# Patient Record
Sex: Male | Born: 1945 | Race: White | Hispanic: No | Smoking: Never smoker
Health system: Southern US, Community
[De-identification: ages and names within clinical notes are randomized; demographics above are authoritative.]

## PROBLEM LIST (undated history)

## (undated) DIAGNOSIS — Z8719 Personal history of other diseases of the digestive system: Secondary | ICD-10-CM

## (undated) DIAGNOSIS — Z8489 Family history of other specified conditions: Secondary | ICD-10-CM

## (undated) DIAGNOSIS — J329 Chronic sinusitis, unspecified: Secondary | ICD-10-CM

## (undated) DIAGNOSIS — IMO0002 Reserved for concepts with insufficient information to code with codable children: Secondary | ICD-10-CM

## (undated) DIAGNOSIS — E669 Obesity, unspecified: Secondary | ICD-10-CM

## (undated) DIAGNOSIS — C44311 Basal cell carcinoma of skin of nose: Secondary | ICD-10-CM

## (undated) DIAGNOSIS — M545 Low back pain, unspecified: Secondary | ICD-10-CM

## (undated) DIAGNOSIS — R5383 Other fatigue: Secondary | ICD-10-CM

## (undated) DIAGNOSIS — M25559 Pain in unspecified hip: Secondary | ICD-10-CM

## (undated) DIAGNOSIS — K219 Gastro-esophageal reflux disease without esophagitis: Secondary | ICD-10-CM

## (undated) DIAGNOSIS — N289 Disorder of kidney and ureter, unspecified: Secondary | ICD-10-CM

## (undated) DIAGNOSIS — T8859XA Other complications of anesthesia, initial encounter: Secondary | ICD-10-CM

## (undated) DIAGNOSIS — E785 Hyperlipidemia, unspecified: Secondary | ICD-10-CM

## (undated) DIAGNOSIS — M1A9XX1 Chronic gout, unspecified, with tophus (tophi): Secondary | ICD-10-CM

## (undated) DIAGNOSIS — K449 Diaphragmatic hernia without obstruction or gangrene: Secondary | ICD-10-CM

## (undated) DIAGNOSIS — Z524 Kidney donor: Secondary | ICD-10-CM

## (undated) DIAGNOSIS — Z9989 Dependence on other enabling machines and devices: Secondary | ICD-10-CM

## (undated) DIAGNOSIS — R0789 Other chest pain: Secondary | ICD-10-CM

## (undated) DIAGNOSIS — Q600 Renal agenesis, unilateral: Secondary | ICD-10-CM

## (undated) DIAGNOSIS — Z905 Acquired absence of kidney: Secondary | ICD-10-CM

## (undated) DIAGNOSIS — IMO0001 Reserved for inherently not codable concepts without codable children: Secondary | ICD-10-CM

## (undated) DIAGNOSIS — I25810 Atherosclerosis of coronary artery bypass graft(s) without angina pectoris: Secondary | ICD-10-CM

## (undated) DIAGNOSIS — I2510 Atherosclerotic heart disease of native coronary artery without angina pectoris: Secondary | ICD-10-CM

## (undated) DIAGNOSIS — M25519 Pain in unspecified shoulder: Secondary | ICD-10-CM

## (undated) DIAGNOSIS — G4733 Obstructive sleep apnea (adult) (pediatric): Secondary | ICD-10-CM

## (undated) DIAGNOSIS — I10 Essential (primary) hypertension: Secondary | ICD-10-CM

## (undated) DIAGNOSIS — T4145XA Adverse effect of unspecified anesthetic, initial encounter: Secondary | ICD-10-CM

## (undated) HISTORY — DX: Disorder of kidney and ureter, unspecified: N28.9

## (undated) HISTORY — DX: Diaphragmatic hernia without obstruction or gangrene: K44.9

## (undated) HISTORY — DX: Renal agenesis, unilateral: Q60.0

## (undated) HISTORY — DX: Low back pain, unspecified: M54.50

## (undated) HISTORY — DX: Other fatigue: R53.83

## (undated) HISTORY — DX: Chronic sinusitis, unspecified: J32.9

## (undated) HISTORY — PX: CORONARY ANGIOPLASTY: SHX604

## (undated) HISTORY — DX: Reserved for concepts with insufficient information to code with codable children: IMO0002

## (undated) HISTORY — DX: Reserved for inherently not codable concepts without codable children: IMO0001

## (undated) HISTORY — DX: Acquired absence of kidney: Z90.5

## (undated) HISTORY — DX: Essential (primary) hypertension: I10

## (undated) HISTORY — PX: TONSILLECTOMY AND ADENOIDECTOMY: SUR1326

## (undated) HISTORY — DX: Other chest pain: R07.89

## (undated) HISTORY — DX: Kidney donor: Z52.4

## (undated) HISTORY — DX: Obesity, unspecified: E66.9

## (undated) HISTORY — DX: Low back pain: M54.5

## (undated) HISTORY — DX: Chronic gout, unspecified, with tophus (tophi): M1A.9XX1

## (undated) HISTORY — DX: Pain in unspecified shoulder: M25.519

## (undated) HISTORY — DX: Atherosclerosis of coronary artery bypass graft(s) without angina pectoris: I25.810

## (undated) HISTORY — PX: REPAIR PERONEAL TENDONS ANKLE: SUR1201

## (undated) HISTORY — DX: Hyperlipidemia, unspecified: E78.5

## (undated) HISTORY — DX: Pain in unspecified hip: M25.559

---

## 2000-07-03 ENCOUNTER — Ambulatory Visit (HOSPITAL_BASED_OUTPATIENT_CLINIC_OR_DEPARTMENT_OTHER): Admission: RE | Admit: 2000-07-03 | Discharge: 2000-07-03 | Payer: Self-pay | Admitting: Orthopedic Surgery

## 2001-02-10 ENCOUNTER — Ambulatory Visit (HOSPITAL_BASED_OUTPATIENT_CLINIC_OR_DEPARTMENT_OTHER): Admission: RE | Admit: 2001-02-10 | Discharge: 2001-02-10 | Payer: Self-pay | Admitting: Family Medicine

## 2002-03-10 ENCOUNTER — Ambulatory Visit (HOSPITAL_BASED_OUTPATIENT_CLINIC_OR_DEPARTMENT_OTHER): Admission: RE | Admit: 2002-03-10 | Discharge: 2002-03-10 | Payer: Self-pay | Admitting: Orthopedic Surgery

## 2004-06-15 ENCOUNTER — Ambulatory Visit (HOSPITAL_COMMUNITY): Admission: RE | Admit: 2004-06-15 | Discharge: 2004-06-15 | Payer: Self-pay | Admitting: Gastroenterology

## 2004-07-09 DIAGNOSIS — Z524 Kidney donor: Secondary | ICD-10-CM

## 2004-07-09 HISTORY — PX: KIDNEY DONATION: SHX685

## 2004-07-09 HISTORY — DX: Kidney donor: Z52.4

## 2010-07-09 DIAGNOSIS — C44311 Basal cell carcinoma of skin of nose: Secondary | ICD-10-CM

## 2010-07-09 HISTORY — PX: BASAL CELL CARCINOMA EXCISION: SHX1214

## 2010-07-09 HISTORY — DX: Basal cell carcinoma of skin of nose: C44.311

## 2011-03-26 ENCOUNTER — Ambulatory Visit: Payer: 59 | Attending: Family Medicine | Admitting: Physical Therapy

## 2011-03-26 DIAGNOSIS — R5381 Other malaise: Secondary | ICD-10-CM | POA: Insufficient documentation

## 2011-03-26 DIAGNOSIS — M545 Low back pain, unspecified: Secondary | ICD-10-CM | POA: Insufficient documentation

## 2011-03-26 DIAGNOSIS — IMO0001 Reserved for inherently not codable concepts without codable children: Secondary | ICD-10-CM | POA: Insufficient documentation

## 2011-03-29 ENCOUNTER — Ambulatory Visit: Payer: 59 | Admitting: *Deleted

## 2011-04-03 ENCOUNTER — Ambulatory Visit: Payer: 59 | Admitting: *Deleted

## 2011-04-05 ENCOUNTER — Ambulatory Visit: Payer: 59 | Admitting: *Deleted

## 2014-10-08 HISTORY — PX: CARDIAC CATHETERIZATION: SHX172

## 2014-11-04 ENCOUNTER — Encounter (HOSPITAL_COMMUNITY)
Admission: RE | Disposition: A | Discharge status: Home / Self Care | Payer: Self-pay | Source: Ambulatory Visit | Attending: Cardiology

## 2014-11-04 ENCOUNTER — Ambulatory Visit (HOSPITAL_COMMUNITY)
Admission: RE | Admit: 2014-11-04 | Discharge: 2014-11-04 | Disposition: A | Discharge status: Home / Self Care | Payer: PPO | Source: Ambulatory Visit | Attending: Cardiology | Admitting: Cardiology

## 2014-11-04 ENCOUNTER — Encounter (HOSPITAL_COMMUNITY): Payer: Self-pay | Admitting: Cardiology

## 2014-11-04 DIAGNOSIS — R9439 Abnormal result of other cardiovascular function study: Secondary | ICD-10-CM

## 2014-11-04 DIAGNOSIS — I2510 Atherosclerotic heart disease of native coronary artery without angina pectoris: Secondary | ICD-10-CM | POA: Diagnosis not present

## 2014-11-04 DIAGNOSIS — E669 Obesity, unspecified: Secondary | ICD-10-CM | POA: Diagnosis not present

## 2014-11-04 DIAGNOSIS — M109 Gout, unspecified: Secondary | ICD-10-CM | POA: Diagnosis not present

## 2014-11-04 DIAGNOSIS — Z6829 Body mass index (BMI) 29.0-29.9, adult: Secondary | ICD-10-CM | POA: Insufficient documentation

## 2014-11-04 DIAGNOSIS — E785 Hyperlipidemia, unspecified: Secondary | ICD-10-CM | POA: Insufficient documentation

## 2014-11-04 DIAGNOSIS — R079 Chest pain, unspecified: Secondary | ICD-10-CM | POA: Diagnosis present

## 2014-11-04 DIAGNOSIS — F1099 Alcohol use, unspecified with unspecified alcohol-induced disorder: Secondary | ICD-10-CM | POA: Insufficient documentation

## 2014-11-04 HISTORY — PX: LEFT HEART CATHETERIZATION WITH CORONARY ANGIOGRAM: SHX5451

## 2014-11-04 SURGERY — LEFT HEART CATHETERIZATION WITH CORONARY ANGIOGRAM

## 2014-11-04 MED ORDER — SODIUM CHLORIDE 0.9 % IJ SOLN: 3.0000 mL | INTRAMUSCULAR | Status: DC | PRN

## 2014-11-04 MED ORDER — ASPIRIN 81 MG PO CHEW
81.0000 mg | CHEWABLE_TABLET | ORAL | Status: AC
  Administered 2014-11-04: 81 mg via ORAL

## 2014-11-04 MED ORDER — SODIUM CHLORIDE 0.9 % IV SOLN
Freq: Once | INTRAVENOUS | Status: AC
  Administered 2014-11-04: 1000 mL via INTRAVENOUS

## 2014-11-04 MED ORDER — SODIUM CHLORIDE 0.9 % IJ SOLN: 3.0000 mL | Freq: Two times a day (BID) | INTRAMUSCULAR | Status: DC

## 2014-11-04 MED ORDER — SODIUM CHLORIDE 0.9 % IV SOLN: 1.0000 mL/kg/h | INTRAVENOUS | Status: DC

## 2014-11-04 MED ORDER — LIDOCAINE HCL (PF) 1 % IJ SOLN
INTRAMUSCULAR | Status: AC
  Filled 2014-11-04: qty 30

## 2014-11-04 MED ORDER — SODIUM CHLORIDE 0.9 % IV SOLN: INTRAVENOUS | Status: DC

## 2014-11-04 MED ORDER — NITROGLYCERIN 1 MG/10 ML FOR IR/CATH LAB
INTRA_ARTERIAL | Status: AC
  Filled 2014-11-04: qty 10

## 2014-11-04 MED ORDER — VERAPAMIL HCL 2.5 MG/ML IV SOLN
INTRAVENOUS | Status: AC
  Filled 2014-11-04: qty 2

## 2014-11-04 MED ORDER — SODIUM CHLORIDE 0.9 % IV SOLN: 250.0000 mL | INTRAVENOUS | Status: DC | PRN

## 2014-11-04 MED ORDER — MIDAZOLAM HCL 2 MG/2ML IJ SOLN
INTRAMUSCULAR | Status: AC
  Filled 2014-11-04: qty 2

## 2014-11-04 MED ORDER — ASPIRIN 81 MG PO CHEW
CHEWABLE_TABLET | ORAL | Status: AC
  Administered 2014-11-04: 81 mg via ORAL
  Filled 2014-11-04: qty 1

## 2014-11-04 MED ORDER — HEPARIN (PORCINE) IN NACL 2-0.9 UNIT/ML-% IJ SOLN
INTRAMUSCULAR | Status: AC
  Filled 2014-11-04: qty 2000

## 2014-11-04 MED ORDER — HYDROMORPHONE HCL 1 MG/ML IJ SOLN
INTRAMUSCULAR | Status: AC
  Filled 2014-11-04: qty 1

## 2014-11-04 NOTE — Interval H&P Note (Signed)
History and Physical Interval Note:  11/04/2014 6:10 AM  Victor Mcclure  has presented today for surgery, with the diagnosis of positive stress  The various methods of treatment have been discussed with the patient and family. After consideration of risks, benefits and other options for treatment, the patient has consented to  Procedure(s): LEFT HEART CATHETERIZATION WITH CORONARY ANGIOGRAM (N/A) and possible PCI as a surgical intervention .  The patient's history has been reviewed, patient examined, no change in status, stable for surgery.  I have reviewed the patient's chart and labs.  Questions were answered to the patient's satisfaction.   Ischemic Symptoms? CCS II (Slight limitation of ordinary activity) Anti-ischemic Medical Therapy? No Therapy Non-invasive Test Results? Low-risk stress test findings: cardiac mortality <1%/year Prior CABG? No Previous CABG   Patient Information:   1-2V CAD, no prox LAD  I (2)  Indication: 14; Score: 2   Patient Information:   CTO of 1 vessel, no other CAD  I (2)  Indication: 24; Score: 2   Patient Information:   1V CAD with prox LAD  U (5)  Indication: 30; Score: 5   Patient Information:   2V-CAD with prox LAD  U (6)  Indication: 36; Score: 6   Patient Information:   3V-CAD without LMCA  U (6)  Indication: 42; Score: 6   Patient Information:   3V-CAD without LMCA With Abnormal LV systolic function  A (9)  Indication: 48; Score: 9   Patient Information:   LMCA-CAD  A (9)  Indication: 49; Score: 9    Victor Mcclure,JAGADEESH R

## 2014-11-04 NOTE — H&P (Signed)
OFFICE VISIT NOTES COPIED TO EPIC FOR DOCUMENTATION   Victor Mcclure 10/27/2014 12:00 PM Location: [facility] Patient #: [id number] DOB: 1946/01/11 Widowed / Language: [name] / Race: White Male    History of Present Illness(Victor Joannie Springs, MD; 10/28/2014 10:07 AM) The patient is a 69 year old male who presents for a follow-up for Chest pain. Victor Mcclure is 69 years old white male. He had noticed exercise induced fatigue 5-6 months ago. Patient felt very tired on walking fast, doing elliptical exercises or stair stepper. 3 months ago, he went on a strict 1000-calorie diet and has not been exercising since then. He denies feeling tired on routine activities. His appetite was normal when he started feeling fatigued with exercise and there was no weight loss at that time. In fact, he had gained a few pounds during the winter. He says that since he went on the diet plan, he has been feeling less hungry and has lost weight with diet.  He is also complaining of discomfort in the precordial region off and on for past 4-5 months. It is not related to exertion and lasts for a few minutes. There is no radiation to the arms, neck or back of the chest. No associated dyspnea, diaphoresis or nausea.  He had noticed shortness of breath on exercise. No dyspnea on routine activities and no orthopnea or PND. No complaints of palpitation, dizziness, near syncope or syncope. No history of swelling on the legs. No leg claudication.  No history of hypertension. He had high blood pressure in the past which was related to stress. No history of diabetes. He has hyperlipidemia. Patient took Lipitor in the past but it had to be stopped because of muscle pains. Patient was prescribed low dose pravastatin recently but he has not started yet.  Patient has history of gout and sleep apnea, uses CPAP. He had donated kidney to his son. No history of thyroid problems, no history of TIA or  CVA. No history of any cardiac problems in the past.  Patient has planned trip to [place] in June and later will go to the [facility].     Problem List/Past Medical(Christie Beane; 10/27/2014 12:16 PM) Obesity (BMI 30.0-34.9) (E66.9) Atypical chest pain (R07.89) Excessive postexertional fatigue (R53.83) Hyperlipidemia, group B (E78.1) Gout (M10.9)    Allergies(Christie Beane; 10/27/2014 12:16 PM) Lipitor *ANTIHYPERLIPIDEMICS*. Muscle pains.    Family History(Christie Beane; 10/27/2014 12:16 PM) Brother 1. older, Known DM Father. Deceased. at age 30, from [place]. Hx of MI in his early 89's. Mother. Deceased. at age 14, from complications of Alzheimer's. No known Heart issues while alive.    Social History(Christie Beane; 10/27/2014 12:16 PM) Marital status. Widowed. Number of Children. 6. 2 Deceased Alcohol Use. Occasional alcohol use. Current tobacco use. Never smoker. Living Situation. Lives alone.    Past Surgical History(Christie Beane; 10/27/2014 12:16 PM) Tonsillectomy. at age 35 Repair to left index finger due to laceration. 1998 Repair to Torn Ligament to right ankle. 2003 Kidney Doantion . 2006 Pt donated a kidney to his son.    Medication History(Christie Beane; 10/27/2014 12:20 PM) Medications Reconciled. Pravastatin Sodium (10MG Tablet, 1 (one) Oral daily, Taken 10/05/2014 to 10/27/2014) Discontinued. (Pt never took medication) Vitamin C (1000MG Tablet, 1 Oral daily) Active. Tart Cherry Advanced (1 Oral daily) Active. Indomethacin (50MG Capsule, 1 Oral as needed for gout) Active. Turmeric (450MG Capsule, 1 Oral daily) Active. Braggs Whole Apple Vinegar 1 oz. (Free Text) (1 oz daily) Active.    Diagnostic  Studies History(Christie Beane; 10/27/2014 12:16 PM) Echocardiogram. 09/29/2014 1. Left ventricle cavity is normal in size. Mild to moderate concentric hypertrophy of the left ventricle. Normal global wall motion.  Calculated EF 62%. 2. Trace mitral regurgitation. 3. Mild tricuspid regurgitation. No evidence of pulmonary hypertension. Treadmill stress test. 10/20/2014 Indication: Atypical chest pain The patient exercised according to Bruce Protocol, Total time recorded 09.28 min achieving max heart rate of 145 which was 94 % of MPHR for age and 10.75METS of work. Normal BP response. There was 2 mm upsloping ST depression with peak exercise which persisted for > 21mnutes into recovery withT changes suggestive ischemia with exercise stress test. Stress terminated due to THR (>85% MPHR)/MPHR met and fatigue. Impression: Abnormal GXT, with positive EKG response to exercise. Endoscopy. 1980 Sleep Study. 2003 Positive, Pt does use CPAP Colonoscopy. 2011 Normal    Review of Systems(Victor KJoannie Springs MD; 10/28/2014 10:28 AM)   Note: GENERAL- Feels tired, fatigue on exercise, No fever, chills. CARDIO VASCULAR- Has chest discomfort, Has shortness of breath on exercise, No orthopnea or PND. No palpitation, dizziness, fainting. No h/o hypertension. Has h/o high cholesterol. No swelling on legs. No claudication in legs, No cramps. No h/o DVT PULMONARY- No cough, phlegm, wheezing, not feeling congested in chest. GASTROINTESTINAL- No abdominal pain, nausea, vomiting or diarrhea. No dark tarry stools.Normal appetite. No heartburn. ENDOCRINE- No Thyroid problem, No feeling of excessive heat or cold, No polydipsia or polyuria. No Diabetes. NEUROLOGICAL- No focal motor or sensory symptoms, Good coordination. No seizures. MUSCULOSKELETAL- No generalized myalgias or muscle weakness. No joint swelling SKIN- No skin rash, No pruritus HEMATOLOGY- No anemia, petechiae, excessive bruising, epistaxis, GI bleed or any abnormal bleeding.    Vitals(Christie Beane; 10/27/2014 12:27 PM) 10/27/2014 12:16 PM Weight: 219.5 lb Height: 70 in Body Surface Area: 2.22 m Body Mass Index: 31.49 kg/m Pulse: 52  (Regular) P.OX: 98% (Room air) BP: 156/88 (Sitting, Left Arm, Standard)     Physical Exam(Victor KJoannie Springs MD; 10/28/2014 10:26 AM) The physical exam findings are as follows:  Note: GENERAL APPEARANCE- Alert, Oriented. Well built, slightly obese. HEENT- Unremarkable, fundi were not examined. NECK- No JVD. Carotid pulses are 2+, No bruits audible. No thyromegaly. No lymphadenopathy. HEART- Auscultation- Normal S1, S2. No gallops or murmurs audible. CHEST- Normal shape, No scoliosis. Normal percussion. Auscultation- Normal breath sounds, No crepitations. No wheezing. ABDOMEN- Palpation- Soft, Nontender. No hepatosplenomegaly. No masses felt. Auscultation- Normal bowel sounds. No bruits audible. EXTREMITIES- No Clubbing or Cyanosis. No edema on legs or feet. PERIPHERAL PULSES- Both femoral pulses- 2+, No bruits audible. Both dorsalis pedis pulses- 2+, Both posterior tibial pulses- 2+    Assessment & Plan(Victor KJoannie Springs MD; 10/28/2014 10:29 AM) Atypical chest pain (R07.89) Current Plans l Started Aspirin EC 81MG, 1 (one) Tablet DR daily, #30, 10/28/2014, No Refill.  Abnormal stress test (R94.39)  Treadmill stress test. 10/20/2014 Indication: Atypical chest pain The patient exercised according to Bruce Protocol, Total time recorded 09.28 min achieving max heart rate of 145 which was 94 % of MPHR for age and 10.75METS of work. Normal BP response. There was 2 mm upsloping ST depression with peak exercise which persisted for > 234mutes into recovery withT changes suggestive ischemia with exercise stress test. Stress terminated due to THR (>85% MPHR)/MPHR met and fatigue. Impression: Abnormal GXT, with positive EKG response to exercise.   Exercise sestamibi stress test 10/29/2014: 1. The patient exercised according to Bruce Protocol, Total time recorded  07.30 min achieving max heart rate of  133  which was 86  % of MPHR for age and  9.33METS of work.  Normal BP  response. There was  2 mm upsloping ST depression with T changes at peak exercise which persisted for > 12mnutes into recovery   suggestive of ischemia with exercise stress test. Stress terminated due to THR (>85% MPHR)/MPHR met and fatigue. 2. There is a moderate area of very mild ischemia in the basal inferior, mid inferior and apical inferior myocardial wall(s).  The left ventricular ejection fraction was calculated or visually estimated to be 63%.   Hyperlipidemia, group B (E78.1)  BMI 31.0-31.9,adult (Z68.31)  Note: Results of treadmill stress test were explained to the patient. It was equivocal for ischemia. Patient had 2 mm upsloping ST depression at peak exercise. His chest pain is atypical. I have advised stress nuclear scans for further evaluation for ischemia. Alternatives including cardiac catheterization was also explained to him. Patient had numerous questions about different options and he was explained about all the options in detail. Patient wants to proceed with the stress nuclear scans and it has been scheduled.  He is also very skeptical about taking any medicine for cholesterol. He was prescribed low-dose pravastatin but patient has not taken it. He did have myalgias with Lipitor in the past. I have advised him to try low-dose pravastatin and if he does get myalgias, he should stop it and [place] Patient again had multiple questions regarding taking treatment for cholesterol. Overall, he said that I do not like to take any medicines.  I have also advised him to start enteric-coated aspirin 81 mg daily. His blood pressure was elevated today but patient was very anxious and thus I have not started any antihypertensive therapy. If the blood pressure remains elevated consistently, he would require treatment.  Primary prevention was again discussed. He was also encouraged to continue diet to lose more weight, continue low-salt, low-cholesterol diet.  I will decide  about the follow-up after stress nuclear scans.  CC Dr. FKathryne Eriksson   Signed electronically by CDespina Hick MD (10/28/2014 10:30 AM)  BP 157/75 mmHg  Pulse 58  Temp(Src) 98.4 F (36.9 C) (Oral)  Resp 18  Ht 5' 10.5" (1.791 m)  Wt 96.163 kg (212 lb)  BMI 29.98 kg/m2  SpO2 99% I have discussed with Dr.Vyas. Patient prefers proceeding with coronary angiography and possible PCI.

## 2014-11-04 NOTE — Discharge Instructions (Signed)
Radial Site Care °Refer to this sheet in the next few weeks. These instructions provide you with information on caring for yourself after your procedure. Your caregiver may also give you more specific instructions. Your treatment has been planned according to current medical practices, but problems sometimes occur. Call your caregiver if you have any problems or questions after your procedure. °HOME CARE INSTRUCTIONS °· You may shower the day after the procedure. Remove the bandage (dressing) and gently wash the site with plain soap and water. Gently pat the site dry. °· Do not apply powder or lotion to the site. °· Do not submerge the affected site in water for 3 to 5 days. °· Inspect the site at least twice daily. °· Do not flex or bend the affected arm for 24 hours. °· No lifting over 5 pounds (2.3 kg) for 5 days after your procedure. °· Do not drive home if you are discharged the same day of the procedure. Have someone else drive you. °· You may drive 24 hours after the procedure unless otherwise instructed by your caregiver. °· Do not operate machinery or power tools for 24 hours. °· A responsible adult should be with you for the first 24 hours after you arrive home. °What to expect: °· Any bruising will usually fade within 1 to 2 weeks. °· Blood that collects in the tissue (hematoma) may be painful to the touch. It should usually decrease in size and tenderness within 1 to 2 weeks. °SEEK IMMEDIATE MEDICAL CARE IF: °· You have unusual pain at the radial site. °· You have redness, warmth, swelling, or pain at the radial site. °· You have drainage (other than a small amount of blood on the dressing). °· You have chills. °· You have a fever or persistent symptoms for more than 72 hours. °· You have a fever and your symptoms suddenly get worse. °· Your arm becomes pale, cool, tingly, or numb. °· You have heavy bleeding from the site. Hold pressure on the site. °Document Released: 07/28/2010 Document Revised:  09/17/2011 Document Reviewed: 07/28/2010 °ExitCare® Patient Information ©2015 [employer]. This information is not intended to replace advice given to you by your health care provider. Make sure you discuss any questions you have with your health care provider. ° °

## 2014-11-04 NOTE — CV Procedure (Signed)
Procedure performed:  Left heart catheterization including hemodynamic monitoring of the left ventricle, LV gram, selective right and left coronary arteriography.  Indication patient is a 69 year-old Caucasian male with history of  hyperlipidemia, who presents with exercise induced fatigue, which has been gradually worsening over the past few months. Patient has  had routine treadmill access to status post pinning for CAD, which was markedly abnormal. Hence underwent nuclear stress testing which revealed normal LVEF, positive EKG changes for ischemia and inferior wall ischemia by stress testing. Due to patient preference  He  is brought to the cardiac catheterization lab to evaluate the  coronary anatomy for definitive diagnosis of CAD. Patient very skeptical of taking any medications.  Hemodynamic data:  Left ventricular pressure was 114/9 with LVEDP of 14 mm mercury. Aortic pressure was 119/76 with a mean of 96 mm mercury. There was no pressure gradient across the aortic valve  Left ventricle: Performed in the RAO projection revealed LVEF of 55 %. There was no significant MR. No wall motion abnormality.  Right coronary artery: Dominant. It is a very large caliber vessel and supplies a very large territory. Is calcified. It is severely diseased in the proximal and midsegment. Along with diffuse disease there are 2 tandem proximal and proximal to mid 90% stenosis. Gives origin to a very large PL and PDA branches. The bifurcation has a 70 to 80% stenosis. The right coronary disease contralateral collaterals from the left coronaries.  Left main coronary artery: Is a large caliber vessel, it is calcified, distal left main shows a 20% stenosis. Left main is long.  Circumflex coronary artery: A large vessel giving origin to a moderate sized obtuse marginal 1. It is diffusely calcified. After the origin of OM1, the circumflex is diffusely diseased constituting anywhere from 40-60% stenosis. OM1 ostium  shows a 20-30% stenosis. Circumflex continues as a large OM 2 and its of very large caliber vessel and supplies a large lateral wall territory.  LAD:  LAD gives origin to a large diagonal-1, a moderate size diagonal 2. The LAD is severely calcified. The diagonal 1 has a proximal 80% stenosis. Diagonal 2 has mild diffuse disease. There is a moderate size diagonal 3 which is free of disease. The LAD itself is severely diseased diffusely. There are tandem mid 90%, 80% stenosis between the origin of D2 and D3. After the origin of D3 there is a focal 70-80% stenosis. The apical LAD is a large caliber vessel. The left coronary arteries both circumflex and the septal perforators from the LAD gives collaterals to the very large right coronary artery.  Impression: Severe triple vessel coronary artery disease, the lesions are amenable for percutaneous revascularization. However patient is not on any medical therapy, I have to discuss with him regarding compliance, he will need probably very long dual antiplatelet therapy if indeed he has multiple stents placed. Also discussions regarding medical therapy, CABG and PCI need to be held again. Hence no intervention was performed today. He'll be followed up in the outpatient basis. I have advised him to bring his family members during discussions. Although symptoms are atypical, his restriction in activity needs to be again reassessed, patient relatively a poor historian.  Technique: Under sterile precautions using a 6 French right radial  arterial access, a 6 French sheath was introduced into the right radial artery. A 5 [place] Tig 4 catheter was advanced into the ascending aorta selective  right coronary artery and left coronary artery was cannulated and angiography was performed  in multiple views. The catheter was pulled back Out of the body over exchange length J-wire. Same Catheter was used to perform LV gram which was performed in RAO projection.  Catheter exchanged  out of the body over J-Wire. NO immediate complications noted. Patient tolerated the procedure. A total of 80 mL of contrast was utilized for diagnostic angiography.

## 2014-11-04 NOTE — Progress Notes (Signed)
TR BAND REMOVAL  LOCATION:    right radial  DEFLATED PER PROTOCOL:    Yes.    TIME BAND OFF / DRESSING APPLIED:    1030   SITE UPON ARRIVAL:    Level 0  SITE AFTER BAND REMOVAL:    Level 0  CIRCULATION SENSATION AND MOVEMENT:    Within Normal Limits   Yes.    COMMENTS:   TRB REMOVED/ TEGADERM DSG APPLIED

## 2014-11-10 ENCOUNTER — Encounter: Payer: Self-pay | Admitting: Cardiology

## 2014-11-10 ENCOUNTER — Ambulatory Visit (INDEPENDENT_AMBULATORY_CARE_PROVIDER_SITE_OTHER): Payer: PPO | Admitting: Cardiology

## 2014-11-10 VITALS — BP 138/100 | HR 64 | Ht 70.5 in | Wt 223.0 lb

## 2014-11-10 DIAGNOSIS — E785 Hyperlipidemia, unspecified: Secondary | ICD-10-CM | POA: Insufficient documentation

## 2014-11-10 DIAGNOSIS — I2510 Atherosclerotic heart disease of native coronary artery without angina pectoris: Secondary | ICD-10-CM | POA: Diagnosis not present

## 2014-11-10 DIAGNOSIS — I2583 Coronary atherosclerosis due to lipid rich plaque: Principal | ICD-10-CM

## 2014-11-10 NOTE — Progress Notes (Signed)
Cardiology Office Note   Date:  11/10/2014   ID:  [name], [name] 12-27-1945, MRN [id number]  PCP:  Woody Seller, MD  Cardiologist:   Candee Furbish, MD       History of Present Illness: Victor Mcclure is a 69 y.o. male who presents for presumed second opinion for severe coronary artery disease. He had cardiac catheterization performed by Dr. Einar Gip on 11/04/14.  Severe triple-vessel coronary artery disease was noted. Crit catheterization was reviewed. Dr. Einar Gip felt that the lesions were amenable to percutaneous intervention however lesion burden is quite extensive and bypass surgery may be best option if symptoms continue despite medical therapy. He had planned on seeing him tomorrow to discuss his further options. Discussed medical therapy/CABG/PCI. No intervention performed at that time.   He is nondiabetic. He noted some symptoms behind his left shoulder and some shortness of breath. A nuclear stress test was performed, 7 minutes and 30 seconds, upsloping ST segment depression 2 mm noted. Inferior wall ischemia noted on perfusion imaging, intermediate to mild risk study.   He was prescribed aspirin 81 mg, metoprolol 25 mg, Crestor 10 mg with the hope to increase this to 20 mg, isosorbide 30 mg. As per prior notes, he was hesitant to take medications.  He was scheduled to go on a trip to [place] the [place]. I encouraged him to begin his medical therapy first, not to travel out of the country at this point but that this may eventually be the case where it is comfortable for him to leave this region.    Past Medical History  Diagnosis Date  . Fatigue   . Chest pressure   . Sinusitis   . Chronic tophaceous gout   . Donor of kidney for transplant   . Hyperlipemia   . Diaphragmatic hernia   . Hypertension   . Acute renal insufficiency   . Lower back pain   . OSA (obstructive sleep apnea)   . Obesity   . Myalgia and myositis   . Pain in joint, pelvic region  and thigh   . Shoulder pain   . Solitary kidney   . Exercise intolerance     Past Surgical History  Procedure Laterality Date  . Left heart catheterization with coronary angiogram N/A 11/04/2014    Procedure: LEFT HEART CATHETERIZATION WITH CORONARY ANGIOGRAM;  Surgeon: Adrian Prows, MD;  Location: [facility] CATH LAB;  Service: Cardiovascular;  Laterality: N/A;  . Kidney donation    . Adenoidectomy    . Tonsillectomy       Current Outpatient Prescriptions  Medication Sig Dispense Refill  . Ascorbic Acid (VITAMIN C) 1000 MG tablet Take 1,000 mg by mouth daily.    Marland Kitchen aspirin 81 MG chewable tablet Chew 81 mg by mouth daily.    Marland Kitchen CHERRY CONCENTRATE PO Take 1 tablet by mouth 2 (two) times daily.    . indomethacin (INDOCIN) 50 MG capsule Take 50 mg by mouth 3 (three) times daily as needed for mild pain or moderate pain.    . Multiple Vitamin (MULTIVITAMIN) tablet Take 1 tablet by mouth daily.    . NON FORMULARY Take 1 oz by mouth 2 (two) times daily. Braggs whole Apple Cider Vinegar    . NON FORMULARY daily. CPAP    . Omega-3 Fatty Acids (FISH OIL PO) Take 1 tablet by mouth daily.    Marland Kitchen PRESCRIPTION MEDICATION Take 1 tablet by mouth daily.    . rosuvastatin (CRESTOR) 10 MG  tablet Take 10 mg by mouth daily.    . Turmeric 500 MG CAPS Take 500 mg by mouth daily.     No current facility-administered medications for this visit.    Allergies:   Statins    Social History:  The patient  reports that he has never smoked. He does not have any smokeless tobacco history on file.   Family History:  The patient's family history includes Heart attack in his father; Heart disease in his mother. There is no history of Stroke.    ROS:  Please see the history of present illness.   Otherwise, review of systems are positive for none.   All other systems are reviewed and negative.    PHYSICAL EXAM: VS:  BP 138/100 mmHg  Pulse 64  Ht 5' 10.5" (1.791 m)  Wt 223 lb (101.152 kg)  BMI 31.53 kg/m2  SpO2 97% , BMI  Body mass index is 31.53 kg/(m^2). GEN: Well nourished, well developed, in no acute distress HEENT: normal Neck: no JVD, carotid bruits, or masses Cardiac: RRR; no murmurs, rubs, or gallops,no edema  Respiratory:  clear to auscultation bilaterally, normal work of breathing GI: soft, nontender, nondistended, + BS MS: no deformity or atrophy Skin: warm and dry, no rash Neuro:  Strength and sensation are intact Psych: euthymic mood, full affect   EKG:  EKG is not ordered today.  Recent Labs: No results found for requested labs within last 365 days.    Lipid Panel No results found for: CHOL, TRIG, HDL, CHOLHDL, VLDL, LDLCALC, LDLDIRECT    Wt Readings from Last 3 Encounters:  11/10/14 223 lb (101.152 kg)      Other studies Reviewed: Additional studies/ records that were reviewed today include: Prior records from Dr. Woody Seller and Dr. Einar Gip.reviewed.  Review of the above records demonstrates: as above   ASSESSMENT AND PLAN:  1.  Severe coronary artery disease-cardiac catheterization reviewed with interventional colleague. Collateral noted to large distal right coronary branch. Diffuse LAD disease, up to 80%. Proximal mid and distal RCA disease quite diffuse. Occluded distal RCA branch with left to right collaterals, right to right collaterals. See cardiac catheterization report for full details. Angiogram personally reviewed.  Dr. Roger Kill was going to see him tomorrow. He just wanted a second opinion. I think that medical management with Crestor, aspirin, beta blocker, isosorbide is important. Expressed the importance of pharmacologic therapy for him. Weight loss, important. I would gradually increase exercise at this point and not do anything terribly vigorous until medications are felt to be efficacious. I did not endorse leaving the country at this point as medical therapy was beginning.  If angina were to continue despite medical therapy, given the diffuse nature of his coronary  artery disease, I would lean towards bypass for a more complete revascularization. I have discussed this with interventional colleague. I have relayed this to him.   I encouraged him to ask these questions tomorrow in his visit with Dr. Einar Gip.  2. Essential hypertension-diastolic elevated today. Encouraged him to take his medications. Prior blood pressure was 130/76 at prior office visits.   Current medicines are reviewed at length with the patient today.  The patient has concerns regarding medicines. I discussed the importance of high-dose statin therapy as well as and anti anginal medications with him.   The following changes have been made:  no change, includes isosorbide, metoprolol, aspirin, Crestor  Labs/ tests ordered today include:  No orders of the defined types were placed in this  encounter.     Disposition:   FU with [place] as needed.   Bobby Rumpf, MD  11/10/2014 11:52 AM    [facility] [place], [place], [place]  [address] Phone: [phone]; Fax: [phone]

## 2014-11-10 NOTE — Patient Instructions (Signed)
Medication Instructions:  Your physician recommends that you continue on your current medications as directed. Please refer to the Current Medication list given to you today.  Labwork: None  Testing/Procedures: None  Follow-Up: Follow up as needed.  Thank you for choosing [facility]!!

## 2014-12-01 ENCOUNTER — Inpatient Hospital Stay: Admit: 2014-12-01 | Payer: Self-pay | Admitting: Cardiology

## 2014-12-02 ENCOUNTER — Inpatient Hospital Stay (HOSPITAL_COMMUNITY): Admission: RE | Admit: 2014-12-02 | Payer: PPO | Source: Ambulatory Visit | Admitting: Cardiology

## 2014-12-02 ENCOUNTER — Encounter (HOSPITAL_COMMUNITY): Admission: RE | Payer: Self-pay | Source: Ambulatory Visit

## 2014-12-02 SURGERY — CORONARY STENT INTERVENTION
Anesthesia: LOCAL

## 2014-12-03 ENCOUNTER — Encounter: Payer: Self-pay | Admitting: Cardiothoracic Surgery

## 2014-12-03 ENCOUNTER — Institutional Professional Consult (permissible substitution) (INDEPENDENT_AMBULATORY_CARE_PROVIDER_SITE_OTHER): Payer: PPO | Admitting: Cardiothoracic Surgery

## 2014-12-03 VITALS — BP 144/87 | HR 58 | Resp 20 | Ht 70.5 in | Wt 219.0 lb

## 2014-12-03 DIAGNOSIS — I2510 Atherosclerotic heart disease of native coronary artery without angina pectoris: Secondary | ICD-10-CM

## 2014-12-03 NOTE — Progress Notes (Signed)
PCP is Woody Seller, MD Referring Provider is Adrian Prows, MD  Chief Complaint  Patient presents with  . Coronary Artery Disease    Surgical eval, Cardiac Cath 11/04/14   patient examined, cardiac catheterization and echocardiogram reviewed  HPI:69 year old Caucasian male nondiabetic nonsmoker presents with recently diagnosed severe three-vessel coronary disease. The patient's symptoms consisted of dyspnea with decreasing exercise tolerance. A stress test was positive and he subsequently underwent cardiac catheterization by Dr.Ganji . This demonstrated severe three-vessel coronary disease with 95% stenosis the RCA, 90% stenosis of the LAD  And 75% stenosis of the circumflex marginal. LV function is preserved. Echocardiogram shows no significant valvular disease.  After the cardiac catheterization the patient was placed on your, beta blocker, and Crestor 10 mg daily. He takes aspirin 81 mg daily.  Patient has no history of diabetes. He denies history of hypertension. He is been a never smoker. His father died of coronary disease-MI.    Past Medical History  Diagnosis Date  . Fatigue   . Chest pressure   . Sinusitis   . Chronic tophaceous gout   . Donor of kidney for transplant   . Hyperlipemia   . Diaphragmatic hernia   . Hypertension   . Acute renal insufficiency   . Lower back pain   . OSA (obstructive sleep apnea)   . Obesity   . Myalgia and myositis   . Pain in joint, pelvic region and thigh   . Shoulder pain   . Solitary kidney   . Exercise intolerance     Past Surgical History  Procedure Laterality Date  . Left heart catheterization with coronary angiogram N/A 11/04/2014    Procedure: LEFT HEART CATHETERIZATION WITH CORONARY ANGIOGRAM;  Surgeon: Adrian Prows, MD;  Location: [facility] CATH LAB;  Service: Cardiovascular;  Laterality: N/A;  . Kidney donation    . Adenoidectomy    . Tonsillectomy      Family History  Problem Relation Age of Onset  . Heart disease Mother   .  Heart attack Father   . Stroke Neg Hx     Social History History  Substance Use Topics  . Smoking status: Never Smoker   . Smokeless tobacco: Not on file  . Alcohol Use: Not on file    Current Outpatient Prescriptions  Medication Sig Dispense Refill  . Acetylcarnitine HCl 500 MG CAPS Take 500 mg by mouth every evening.    . Ascorbic Acid (VITAMIN C) 1000 MG tablet Take 1,000 mg by mouth daily.    Marland Kitchen aspirin EC 81 MG tablet Take 81 mg by mouth daily.    Marland Kitchen CHERRY CONCENTRATE PO Take 1 tablet by mouth 2 (two) times daily.    . Coenzyme Q10 (CO Q 10) 100 MG CAPS Take 100 mg by mouth daily.    . indomethacin (INDOCIN) 50 MG capsule Take 50 mg by mouth 3 (three) times daily as needed for mild pain or moderate pain.    . isosorbide mononitrate (IMDUR) 30 MG 24 hr tablet Take 30 mg by mouth every evening.    . Magnesium 500 MG CAPS Take 500 mg by mouth daily.    . metoprolol succinate (TOPROL-XL) 25 MG 24 hr tablet Take 25 mg by mouth every evening.    . Multiple Vitamin (MULTIVITAMIN) tablet Take 1 tablet by mouth daily.    . NON FORMULARY Take 1 oz by mouth 2 (two) times daily. Braggs whole Apple Cider Vinegar    . NON FORMULARY daily. CPAP    .  Omega-3 Fatty Acids (FISH OIL PO) Take 1 tablet by mouth daily.    . Red Yeast Rice Extract (RED YEAST RICE PO) Take 1,200 mg by mouth daily.    . rosuvastatin (CRESTOR) 10 MG tablet Take 10 mg by mouth every evening.    . TURMERIC PO Take 750 mg by mouth daily.     No current facility-administered medications for this visit.    Allergies  Allergen Reactions  . Statins     Joint ache. Tolerating Crestor.     Review of Systems   Review of Systems  General:  No weight loss   no fever   no decreased energy  no night sweats Cardiac:  -Chest pain with exertion - resting chest pain   +SOB with exertion   -Orthopnea                 - PND - ankle edema - syncope Pulmonary:  +dyspnea,no cough, no productive cough no home oxygen no hemoptysis +  sleep apnea, uses CPAP 11 cm h20 pressure Q HS GI: no difficulty swallowing  no GERD no jaundice  no melena  no hematemesis no        abdominal pain GU:  no dysuria  no hematuria  no frequent UTI no BPH    The patient donated his right kidney for kidney transplant Vascular:  no claudication  No TIA  No varicose veins no DVT Neuro:  no sroke no seizures no TIA no head trauma no vision changes, patient is right-hand dominant Musculoskeletal:  normal mobility no arthritis  Positive history of intermittent gout gout  no joint swelling Skin: no rash  no skin ulceration  no skin cancer Endocrine: diabetes  no thyroid didease Hematologic: no easy bruising  no blood transfusions  no frequent epistaxis ENT : no painful teeth no dentures no loose teeth Psych : no anxiety  no depression  no psych hospitalizations        BP 144/87 mmHg  Pulse 58  Resp 20  Ht 5' 10.5" (1.791 m)  Wt 219 lb (99.338 kg)  BMI 30.97 kg/m2  SpO2 97% Physical Exam      Physical Exam  General: overweight middle-aged Caucasian male no acute distress, anxious HEENT: Normocephalic pupils equal , dentition adequate Neck: Supple without JVD, adenopathy, or bruit Chest: Clear to auscultation, symmetrical breath sounds, no rhonchi, no tenderness             or deformity Cardiovascular: Regular rate and rhythm, no murmur, no gallop, peripheral pulses             palpable in all extremities Abdomen:  Soft, nontender, no palpable mass or organomegaly Extremities: Warm, well-perfused, no clubbing cyanosis edema or tenderness,no hematoma at cath site right radial artery              no venous stasis changes of the legs Rectal/GU: Deferred Neuro: Grossly non--focal and symmetrical throughout Skin: Clean and dry without rash or ulceration   Diagnostic Tests: Images of coronary angiogram and echocardiogram purse reviewed and discussed with patient  Impression: Severe multivessel coronary disease Positive stress  test Patient has been assessed for PCI by 2 cardiologists who  feel that CABG as the best long-term option and I agree.  Plan:the procedure CABG was discussed in detail the patient and his partner. The patient understands the indications benefits alternatives and risks. He wishes to have the procedure done in the near term so we will schedule him for multivessel  CABG on Thursday, June 2 at [facility] hospital.   Len Childs, MD [facility] [phone]

## 2014-12-07 ENCOUNTER — Other Ambulatory Visit (HOSPITAL_COMMUNITY): Payer: Self-pay | Admitting: Respiratory Therapy

## 2014-12-07 ENCOUNTER — Other Ambulatory Visit: Payer: Self-pay | Admitting: *Deleted

## 2014-12-07 DIAGNOSIS — I2510 Atherosclerotic heart disease of native coronary artery without angina pectoris: Secondary | ICD-10-CM

## 2014-12-08 ENCOUNTER — Encounter (HOSPITAL_COMMUNITY): Payer: Self-pay

## 2014-12-08 ENCOUNTER — Other Ambulatory Visit: Payer: Self-pay

## 2014-12-08 ENCOUNTER — Ambulatory Visit (HOSPITAL_COMMUNITY)
Admission: RE | Admit: 2014-12-08 | Discharge: 2014-12-08 | Disposition: A | Discharge status: Home / Self Care | Payer: PPO | Source: Ambulatory Visit | Attending: Cardiothoracic Surgery | Admitting: Cardiothoracic Surgery

## 2014-12-08 ENCOUNTER — Encounter (HOSPITAL_COMMUNITY)
Admission: RE | Admit: 2014-12-08 | Discharge: 2014-12-08 | Disposition: A | Discharge status: Home / Self Care | Payer: PPO | Source: Ambulatory Visit | Attending: Cardiothoracic Surgery | Admitting: Cardiothoracic Surgery

## 2014-12-08 ENCOUNTER — Other Ambulatory Visit (HOSPITAL_COMMUNITY): Payer: PPO

## 2014-12-08 VITALS — BP 151/88 | HR 97 | Temp 97.9°F | Resp 20 | Ht 69.0 in | Wt 222.1 lb

## 2014-12-08 DIAGNOSIS — Z7982 Long term (current) use of aspirin: Secondary | ICD-10-CM

## 2014-12-08 DIAGNOSIS — R001 Bradycardia, unspecified: Secondary | ICD-10-CM | POA: Insufficient documentation

## 2014-12-08 DIAGNOSIS — Z888 Allergy status to other drugs, medicaments and biological substances status: Secondary | ICD-10-CM

## 2014-12-08 DIAGNOSIS — I2510 Atherosclerotic heart disease of native coronary artery without angina pectoris: Secondary | ICD-10-CM

## 2014-12-08 DIAGNOSIS — Z79899 Other long term (current) drug therapy: Secondary | ICD-10-CM

## 2014-12-08 DIAGNOSIS — I4891 Unspecified atrial fibrillation: Secondary | ICD-10-CM | POA: Diagnosis not present

## 2014-12-08 DIAGNOSIS — Z01818 Encounter for other preprocedural examination: Secondary | ICD-10-CM

## 2014-12-08 DIAGNOSIS — I25119 Atherosclerotic heart disease of native coronary artery with unspecified angina pectoris: Secondary | ICD-10-CM | POA: Diagnosis not present

## 2014-12-08 DIAGNOSIS — R0789 Other chest pain: Secondary | ICD-10-CM | POA: Insufficient documentation

## 2014-12-08 DIAGNOSIS — E8770 Fluid overload, unspecified: Secondary | ICD-10-CM | POA: Diagnosis not present

## 2014-12-08 DIAGNOSIS — K449 Diaphragmatic hernia without obstruction or gangrene: Secondary | ICD-10-CM | POA: Insufficient documentation

## 2014-12-08 DIAGNOSIS — D62 Acute posthemorrhagic anemia: Secondary | ICD-10-CM | POA: Diagnosis not present

## 2014-12-08 DIAGNOSIS — M109 Gout, unspecified: Secondary | ICD-10-CM | POA: Diagnosis present

## 2014-12-08 DIAGNOSIS — J939 Pneumothorax, unspecified: Secondary | ICD-10-CM | POA: Diagnosis not present

## 2014-12-08 DIAGNOSIS — Z905 Acquired absence of kidney: Secondary | ICD-10-CM | POA: Diagnosis present

## 2014-12-08 DIAGNOSIS — I6523 Occlusion and stenosis of bilateral carotid arteries: Secondary | ICD-10-CM | POA: Insufficient documentation

## 2014-12-08 DIAGNOSIS — Z8249 Family history of ischemic heart disease and other diseases of the circulatory system: Secondary | ICD-10-CM

## 2014-12-08 DIAGNOSIS — Z6831 Body mass index (BMI) 31.0-31.9, adult: Secondary | ICD-10-CM

## 2014-12-08 DIAGNOSIS — E785 Hyperlipidemia, unspecified: Secondary | ICD-10-CM | POA: Diagnosis present

## 2014-12-08 DIAGNOSIS — Z0183 Encounter for blood typing: Secondary | ICD-10-CM | POA: Insufficient documentation

## 2014-12-08 DIAGNOSIS — I10 Essential (primary) hypertension: Secondary | ICD-10-CM | POA: Insufficient documentation

## 2014-12-08 DIAGNOSIS — Z01812 Encounter for preprocedural laboratory examination: Secondary | ICD-10-CM

## 2014-12-08 DIAGNOSIS — E669 Obesity, unspecified: Secondary | ICD-10-CM | POA: Diagnosis present

## 2014-12-08 DIAGNOSIS — G4733 Obstructive sleep apnea (adult) (pediatric): Secondary | ICD-10-CM | POA: Diagnosis present

## 2014-12-08 HISTORY — DX: Atherosclerotic heart disease of native coronary artery without angina pectoris: I25.10

## 2014-12-08 LAB — COMPREHENSIVE METABOLIC PANEL
ALT: 22 U/L (ref 17–63)
AST: 28 U/L (ref 15–41)
Albumin: 4 g/dL (ref 3.5–5.0)
Alkaline Phosphatase: 50 U/L (ref 38–126)
Anion gap: 11 (ref 5–15)
BUN: 23 mg/dL — ABNORMAL HIGH (ref 6–20)
CO2: 20 mmol/L — ABNORMAL LOW (ref 22–32)
Calcium: 9.3 mg/dL (ref 8.9–10.3)
Chloride: 108 mmol/L (ref 101–111)
Creatinine, Ser: 1.18 mg/dL (ref 0.61–1.24)
GFR calc Af Amer: >60 mL/min (ref >60)
GFR calc non Af Amer: >60 mL/min (ref >60)
Glucose, Bld: 91 mg/dL (ref 65–99)
Potassium: 4.2 mmol/L (ref 3.5–5.1)
Sodium: 139 mmol/L (ref 135–145)
Total Bilirubin: 0.5 mg/dL (ref 0.3–1.2)
Total Protein: 6.6 g/dL (ref 6.5–8.1)

## 2014-12-08 LAB — URINALYSIS, ROUTINE W REFLEX MICROSCOPIC
Bilirubin Urine: NEGATIVE
Glucose, UA: NEGATIVE mg/dL
Hgb urine dipstick: NEGATIVE
Ketones, ur: NEGATIVE mg/dL
Leukocytes, UA: NEGATIVE
Nitrite: NEGATIVE
Protein, ur: NEGATIVE mg/dL
Specific Gravity, Urine: 1.018 (ref 1.005–1.030)
Urobilinogen, UA: 0.2 mg/dL (ref 0.0–1.0)
pH: 6 (ref 5.0–8.0)

## 2014-12-08 LAB — BLOOD GAS, ARTERIAL
Acid-Base Excess: 0 mmol/L (ref 0.0–2.0)
Bicarbonate: 24 mEq/L (ref 20.0–24.0)
Drawn by: 421801
FIO2: 0.21 %
O2 Saturation: 97.2 %
Patient temperature: 98.6
TCO2: 25.1 mmol/L (ref 0–100)
pCO2 arterial: 37.8 mmHg (ref 35.0–45.0)
pH, Arterial: 7.419 (ref 7.350–7.450)
pO2, Arterial: 92.8 mmHg (ref 80.0–100.0)

## 2014-12-08 LAB — TYPE AND SCREEN
ABO/RH(D): O POS
Antibody Screen: NEGATIVE

## 2014-12-08 LAB — CBC
HCT: 43 % (ref 39.0–52.0)
Hemoglobin: 14.7 g/dL (ref 13.0–17.0)
MCH: 30.6 pg (ref 26.0–34.0)
MCHC: 34.2 g/dL (ref 30.0–36.0)
MCV: 89.4 fL (ref 78.0–100.0)
Platelets: 122 10*3/uL — ABNORMAL LOW (ref 150–400)
RBC: 4.81 MIL/uL (ref 4.22–5.81)
RDW: 12.8 % (ref 11.5–15.5)
WBC: 6.3 10*3/uL (ref 4.0–10.5)

## 2014-12-08 LAB — PULMONARY FUNCTION TEST
DL/VA % pred: 104 %
DL/VA: 4.76 ml/min/mmHg/L
DLCO unc % pred: 105 %
DLCO unc: 32.75 ml/min/mmHg
FEF 25-75 Post: 4.82 L/sec
FEF 25-75 Pre: 3.37 L/sec
FEF2575-%Change-Post: 43 %
FEF2575-%Pred-Post: 189 %
FEF2575-%Pred-Pre: 132 %
FEV1-%Change-Post: 11 %
FEV1-%Pred-Post: 122 %
FEV1-%Pred-Pre: 110 %
FEV1-Post: 3.99 L
FEV1-Pre: 3.59 L
FEV1FVC-%Change-Post: 8 %
FEV1FVC-%Pred-Pre: 106 %
FEV6-%Change-Post: 2 %
FEV6-%Pred-Post: 111 %
FEV6-%Pred-Pre: 109 %
FEV6-Post: 4.64 L
FEV6-Pre: 4.53 L
FEV6FVC-%Change-Post: 0 %
FEV6FVC-%Pred-Post: 104 %
FEV6FVC-%Pred-Pre: 104 %
FVC-%Change-Post: 2 %
FVC-%Pred-Post: 106 %
FVC-%Pred-Pre: 103 %
FVC-Post: 4.67 L
FVC-Pre: 4.55 L
Post FEV1/FVC ratio: 86 %
Post FEV6/FVC ratio: 99 %
Pre FEV1/FVC ratio: 79 %
Pre FEV6/FVC Ratio: 99 %
RV % pred: 82 %
RV: 1.93 L
TLC % pred: 104 %
TLC: 7.14 L

## 2014-12-08 LAB — PROTIME-INR
INR: 1.01 (ref 0.00–1.49)
Prothrombin Time: 13.5 seconds (ref 11.6–15.2)

## 2014-12-08 LAB — ABO/RH: ABO/RH(D): O POS

## 2014-12-08 LAB — APTT: aPTT: 29 seconds (ref 24–37)

## 2014-12-08 LAB — SURGICAL PCR SCREEN
MRSA, PCR: NEGATIVE
Staphylococcus aureus: POSITIVE — AB

## 2014-12-08 MED ORDER — ALBUTEROL SULFATE (2.5 MG/3ML) 0.083% IN NEBU
2.5000 mg | INHALATION_SOLUTION | Freq: Once | RESPIRATORY_TRACT | Status: AC
  Administered 2014-12-08: 2.5 mg via RESPIRATORY_TRACT

## 2014-12-08 NOTE — Pre-Procedure Instructions (Signed)
    Victor Mcclure  12/08/2014      [facility] - [place], [place] - [address] [place]  [address] Phone: [phone] Fax: [phone]  [facility] [address] [place] [address] Phone: [phone] Fax: [phone]    Your procedure is scheduled on 12-10-2014  Friday .  Report to [facility] Admitting at 5:30 A.M.   Call this number if you have problems the morning of surgery:  [phone]   Remember:  Do not eat food or drink liquids after midnight.    Take these medicines the morning of surgery with A SIP OF WATER none   Do not wear jewelry   Do not wear lotions, powders, or perfumes.     Do not shave 48 hours prior to surgery.  Men may shave face and neck.   Do not bring valuables to the hospital.  [facility] is not responsible for any belongings or valuables.  Contacts, dentures or bridgework may not be worn into surgery.  Leave your suitcase in the car.  After surgery it may be brought to your room.  For patients admitted to the hospital, discharge time will be determined by your treatment team.  Patients discharged the day of surgery will not be allowed to drive home.    Special instructions:  See attached sheet for instructions on CHG shower/bath  Please read over the following fact sheets that you were given. Pain Booklet, Coughing and Deep Breathing, Blood Transfusion Information and Surgical Site Infection Prevention

## 2014-12-08 NOTE — Progress Notes (Signed)
VASCULAR LAB PRELIMINARY  PRELIMINARY  PRELIMINARY  PRELIMINARY  Pre-op Cardiac Surgery  Carotid Findings:  Bilateral:  1-39% ICA stenosis.  Vertebral artery flow is antegrade.      Upper Extremity Right Left  Brachial Pressures Triphasic 125 Triphasic 135  Radial Waveforms Triphasic  Triphasic   Ulnar Waveforms Triphasic  Triphasic   Palmar Arch (Allen's Test) Within normal limits Within normal limits     Lower  Extremity Right Left  Dorsalis Pedis    Anterior Tibial Palpable X 4   Posterior Tibial    Ankle/Brachial Indices       Victor Mcclure, RVT 12/08/2014, 9:38 AM

## 2014-12-08 NOTE — Progress Notes (Signed)
Mupirocin Ointment Rx called in [place] in [place] for positive PCR of staph. Pt notified and voiced understanding.

## 2014-12-09 LAB — HEMOGLOBIN A1C
Hgb A1c MFr Bld: 5.6 % (ref 4.8–5.6)
Mean Plasma Glucose: 114 mg/dL

## 2014-12-09 MED ORDER — PLASMA-LYTE 148 IV SOLN
INTRAVENOUS | Status: AC
  Administered 2014-12-10: 500 mL
  Filled 2014-12-09 (×2): qty 2.5

## 2014-12-09 MED ORDER — DOPAMINE-DEXTROSE 3.2-5 MG/ML-% IV SOLN
0.0000 ug/kg/min | INTRAVENOUS | Status: AC
  Administered 2014-12-10: 3 ug/kg/min via INTRAVENOUS
  Filled 2014-12-09: qty 250

## 2014-12-09 MED ORDER — SODIUM CHLORIDE 0.9 % IV SOLN
INTRAVENOUS | Status: DC
  Filled 2014-12-09: qty 40

## 2014-12-09 MED ORDER — DEXMEDETOMIDINE HCL IN NACL 400 MCG/100ML IV SOLN
0.1000 ug/kg/h | INTRAVENOUS | Status: AC
  Administered 2014-12-10: .2 ug/kg/h via INTRAVENOUS
  Filled 2014-12-09: qty 100

## 2014-12-09 MED ORDER — PHENYLEPHRINE HCL 10 MG/ML IJ SOLN
30.0000 ug/min | INTRAVENOUS | Status: DC
  Filled 2014-12-09: qty 2

## 2014-12-09 MED ORDER — DEXTROSE 5 % IV SOLN
750.0000 mg | INTRAVENOUS | Status: DC
  Filled 2014-12-09: qty 750

## 2014-12-09 MED ORDER — METOPROLOL TARTRATE 12.5 MG HALF TABLET: 12.5000 mg | ORAL_TABLET | Freq: Once | ORAL | Status: DC

## 2014-12-09 MED ORDER — POTASSIUM CHLORIDE 2 MEQ/ML IV SOLN
80.0000 meq | INTRAVENOUS | Status: DC
  Filled 2014-12-09: qty 40

## 2014-12-09 MED ORDER — SODIUM CHLORIDE 0.9 % IV SOLN
INTRAVENOUS | Status: DC
  Filled 2014-12-09: qty 30

## 2014-12-09 MED ORDER — NITROGLYCERIN IN D5W 200-5 MCG/ML-% IV SOLN
2.0000 ug/min | INTRAVENOUS | Status: DC
  Filled 2014-12-09: qty 250

## 2014-12-09 MED ORDER — SODIUM CHLORIDE 0.9 % IV SOLN
INTRAVENOUS | Status: DC
  Administered 2014-12-10: 2.5 [IU]/h via INTRAVENOUS
  Filled 2014-12-09: qty 2.5

## 2014-12-09 MED ORDER — CEFUROXIME SODIUM 1.5 G IJ SOLR
1.5000 g | INTRAMUSCULAR | Status: AC
  Administered 2014-12-10: 1.5 g via INTRAVENOUS
  Administered 2014-12-10: .75 g via INTRAVENOUS
  Filled 2014-12-09: qty 1.5

## 2014-12-09 MED ORDER — MAGNESIUM SULFATE 50 % IJ SOLN
40.0000 meq | INTRAMUSCULAR | Status: DC
  Filled 2014-12-09: qty 10

## 2014-12-09 MED ORDER — EPINEPHRINE HCL 1 MG/ML IJ SOLN
0.0000 ug/min | INTRAVENOUS | Status: DC
  Filled 2014-12-09: qty 4

## 2014-12-09 MED ORDER — VANCOMYCIN HCL 10 G IV SOLR
1500.0000 mg | INTRAVENOUS | Status: AC
  Administered 2014-12-10: 1500 mg via INTRAVENOUS
  Filled 2014-12-09 (×2): qty 1500

## 2014-12-10 ENCOUNTER — Inpatient Hospital Stay (HOSPITAL_COMMUNITY): Payer: PPO

## 2014-12-10 ENCOUNTER — Encounter (HOSPITAL_COMMUNITY): Payer: Self-pay | Admitting: *Deleted

## 2014-12-10 ENCOUNTER — Encounter (HOSPITAL_COMMUNITY)
Admission: RE | Disposition: A | Discharge status: Home / Self Care | Payer: PPO | Source: Ambulatory Visit | Attending: Cardiothoracic Surgery

## 2014-12-10 ENCOUNTER — Inpatient Hospital Stay (HOSPITAL_COMMUNITY): Payer: PPO | Admitting: Certified Registered"

## 2014-12-10 ENCOUNTER — Inpatient Hospital Stay (HOSPITAL_COMMUNITY)
Admission: RE | Admit: 2014-12-10 | Discharge: 2014-12-15 | DRG: 236 | Disposition: A | Discharge status: Home / Self Care | Payer: PPO | Source: Ambulatory Visit | Attending: Cardiothoracic Surgery | Admitting: Cardiothoracic Surgery

## 2014-12-10 DIAGNOSIS — E669 Obesity, unspecified: Secondary | ICD-10-CM | POA: Diagnosis present

## 2014-12-10 DIAGNOSIS — Z905 Acquired absence of kidney: Secondary | ICD-10-CM | POA: Diagnosis present

## 2014-12-10 DIAGNOSIS — E785 Hyperlipidemia, unspecified: Secondary | ICD-10-CM | POA: Diagnosis present

## 2014-12-10 DIAGNOSIS — G4733 Obstructive sleep apnea (adult) (pediatric): Secondary | ICD-10-CM | POA: Diagnosis present

## 2014-12-10 DIAGNOSIS — I25110 Atherosclerotic heart disease of native coronary artery with unstable angina pectoris: Secondary | ICD-10-CM | POA: Diagnosis not present

## 2014-12-10 DIAGNOSIS — Z951 Presence of aortocoronary bypass graft: Secondary | ICD-10-CM

## 2014-12-10 DIAGNOSIS — I2510 Atherosclerotic heart disease of native coronary artery without angina pectoris: Secondary | ICD-10-CM | POA: Diagnosis present

## 2014-12-10 DIAGNOSIS — Z9689 Presence of other specified functional implants: Secondary | ICD-10-CM

## 2014-12-10 DIAGNOSIS — Z888 Allergy status to other drugs, medicaments and biological substances status: Secondary | ICD-10-CM | POA: Diagnosis not present

## 2014-12-10 DIAGNOSIS — Z6831 Body mass index (BMI) 31.0-31.9, adult: Secondary | ICD-10-CM | POA: Diagnosis not present

## 2014-12-10 DIAGNOSIS — E8770 Fluid overload, unspecified: Secondary | ICD-10-CM | POA: Diagnosis not present

## 2014-12-10 DIAGNOSIS — Z79899 Other long term (current) drug therapy: Secondary | ICD-10-CM | POA: Diagnosis not present

## 2014-12-10 DIAGNOSIS — Z4682 Encounter for fitting and adjustment of non-vascular catheter: Secondary | ICD-10-CM

## 2014-12-10 DIAGNOSIS — Z8249 Family history of ischemic heart disease and other diseases of the circulatory system: Secondary | ICD-10-CM | POA: Diagnosis not present

## 2014-12-10 DIAGNOSIS — J939 Pneumothorax, unspecified: Secondary | ICD-10-CM

## 2014-12-10 DIAGNOSIS — Z7982 Long term (current) use of aspirin: Secondary | ICD-10-CM | POA: Diagnosis not present

## 2014-12-10 DIAGNOSIS — I25119 Atherosclerotic heart disease of native coronary artery with unspecified angina pectoris: Secondary | ICD-10-CM | POA: Diagnosis present

## 2014-12-10 DIAGNOSIS — I4891 Unspecified atrial fibrillation: Secondary | ICD-10-CM | POA: Diagnosis not present

## 2014-12-10 DIAGNOSIS — J95811 Postprocedural pneumothorax: Secondary | ICD-10-CM | POA: Diagnosis not present

## 2014-12-10 DIAGNOSIS — D62 Acute posthemorrhagic anemia: Secondary | ICD-10-CM | POA: Diagnosis not present

## 2014-12-10 DIAGNOSIS — M109 Gout, unspecified: Secondary | ICD-10-CM | POA: Diagnosis present

## 2014-12-10 HISTORY — DX: Obstructive sleep apnea (adult) (pediatric): G47.33

## 2014-12-10 HISTORY — DX: Adverse effect of unspecified anesthetic, initial encounter: T41.45XA

## 2014-12-10 HISTORY — DX: Obstructive sleep apnea (adult) (pediatric): Z99.89

## 2014-12-10 HISTORY — PX: TEE WITHOUT CARDIOVERSION: SHX5443

## 2014-12-10 HISTORY — DX: Personal history of other diseases of the digestive system: Z87.19

## 2014-12-10 HISTORY — PX: CORONARY ARTERY BYPASS GRAFT: SHX141

## 2014-12-10 HISTORY — DX: Other complications of anesthesia, initial encounter: T88.59XA

## 2014-12-10 HISTORY — DX: Family history of other specified conditions: Z84.89

## 2014-12-10 HISTORY — DX: Gastro-esophageal reflux disease without esophagitis: K21.9

## 2014-12-10 LAB — POCT I-STAT, CHEM 8
BUN: 23 mg/dL — ABNORMAL HIGH (ref 6–20)
BUN: 19 mg/dL (ref 6–20)
BUN: 20 mg/dL (ref 6–20)
BUN: 18 mg/dL (ref 6–20)
BUN: 17 mg/dL (ref 6–20)
BUN: 17 mg/dL (ref 6–20)
BUN: 15 mg/dL (ref 6–20)
Calcium, Ion: 1.25 mmol/L (ref 1.13–1.30)
Calcium, Ion: 1.08 mmol/L — ABNORMAL LOW (ref 1.13–1.30)
Calcium, Ion: 1.18 mmol/L (ref 1.13–1.30)
Calcium, Ion: 1.01 mmol/L — ABNORMAL LOW (ref 1.13–1.30)
Calcium, Ion: 1.01 mmol/L — ABNORMAL LOW (ref 1.13–1.30)
Calcium, Ion: 1.02 mmol/L — ABNORMAL LOW (ref 1.13–1.30)
Calcium, Ion: 1.13 mmol/L (ref 1.13–1.30)
Chloride: 106 mmol/L (ref 101–111)
Chloride: 102 mmol/L (ref 101–111)
Chloride: 107 mmol/L (ref 101–111)
Chloride: 101 mmol/L (ref 101–111)
Chloride: 103 mmol/L (ref 101–111)
Chloride: 104 mmol/L (ref 101–111)
Chloride: 104 mmol/L (ref 101–111)
Creatinine, Ser: 1 mg/dL (ref 0.61–1.24)
Creatinine, Ser: 0.9 mg/dL (ref 0.61–1.24)
Creatinine, Ser: 1 mg/dL (ref 0.61–1.24)
Creatinine, Ser: 0.8 mg/dL (ref 0.61–1.24)
Creatinine, Ser: 0.8 mg/dL (ref 0.61–1.24)
Creatinine, Ser: 0.9 mg/dL (ref 0.61–1.24)
Creatinine, Ser: 0.9 mg/dL (ref 0.61–1.24)
Glucose, Bld: 111 mg/dL — ABNORMAL HIGH (ref 65–99)
Glucose, Bld: 118 mg/dL — ABNORMAL HIGH (ref 65–99)
Glucose, Bld: 127 mg/dL — ABNORMAL HIGH (ref 65–99)
Glucose, Bld: 132 mg/dL — ABNORMAL HIGH (ref 65–99)
Glucose, Bld: 144 mg/dL — ABNORMAL HIGH (ref 65–99)
Glucose, Bld: 167 mg/dL — ABNORMAL HIGH (ref 65–99)
Glucose, Bld: 144 mg/dL — ABNORMAL HIGH (ref 65–99)
HCT: 38 % — ABNORMAL LOW (ref 39.0–52.0)
HCT: 30 % — ABNORMAL LOW (ref 39.0–52.0)
HCT: 36 % — ABNORMAL LOW (ref 39.0–52.0)
HCT: 28 % — ABNORMAL LOW (ref 39.0–52.0)
HCT: 28 % — ABNORMAL LOW (ref 39.0–52.0)
HCT: 30 % — ABNORMAL LOW (ref 39.0–52.0)
HCT: 40 % (ref 39.0–52.0)
Hemoglobin: 12.9 g/dL — ABNORMAL LOW (ref 13.0–17.0)
Hemoglobin: 10.2 g/dL — ABNORMAL LOW (ref 13.0–17.0)
Hemoglobin: 12.2 g/dL — ABNORMAL LOW (ref 13.0–17.0)
Hemoglobin: 9.5 g/dL — ABNORMAL LOW (ref 13.0–17.0)
Hemoglobin: 9.5 g/dL — ABNORMAL LOW (ref 13.0–17.0)
Hemoglobin: 10.2 g/dL — ABNORMAL LOW (ref 13.0–17.0)
Hemoglobin: 13.6 g/dL (ref 13.0–17.0)
Potassium: 4.2 mmol/L (ref 3.5–5.1)
Potassium: 4.2 mmol/L (ref 3.5–5.1)
Potassium: 4.5 mmol/L (ref 3.5–5.1)
Potassium: 4.5 mmol/L (ref 3.5–5.1)
Potassium: 5 mmol/L (ref 3.5–5.1)
Potassium: 3.9 mmol/L (ref 3.5–5.1)
Potassium: 4.6 mmol/L (ref 3.5–5.1)
Sodium: 141 mmol/L (ref 135–145)
Sodium: 135 mmol/L (ref 135–145)
Sodium: 139 mmol/L (ref 135–145)
Sodium: 136 mmol/L (ref 135–145)
Sodium: 137 mmol/L (ref 135–145)
Sodium: 138 mmol/L (ref 135–145)
Sodium: 137 mmol/L (ref 135–145)
TCO2: 23 mmol/L (ref 0–100)
TCO2: 20 mmol/L (ref 0–100)
TCO2: 22 mmol/L (ref 0–100)
TCO2: 21 mmol/L (ref 0–100)
TCO2: 22 mmol/L (ref 0–100)
TCO2: 19 mmol/L (ref 0–100)
TCO2: 20 mmol/L (ref 0–100)

## 2014-12-10 LAB — POCT I-STAT 3, ART BLOOD GAS (G3+)
Acid-base deficit: 1 mmol/L (ref 0.0–2.0)
Acid-base deficit: 3 mmol/L — ABNORMAL HIGH (ref 0.0–2.0)
Acid-base deficit: 2 mmol/L (ref 0.0–2.0)
Acid-base deficit: 4 mmol/L — ABNORMAL HIGH (ref 0.0–2.0)
Acid-base deficit: 5 mmol/L — ABNORMAL HIGH (ref 0.0–2.0)
Bicarbonate: 23.3 mEq/L (ref 20.0–24.0)
Bicarbonate: 21.4 mEq/L (ref 20.0–24.0)
Bicarbonate: 23.7 mEq/L (ref 20.0–24.0)
Bicarbonate: 20.5 mEq/L (ref 20.0–24.0)
Bicarbonate: 21.5 mEq/L (ref 20.0–24.0)
O2 Saturation: 100 %
O2 Saturation: 95 %
O2 Saturation: 97 %
O2 Saturation: 96 %
O2 Saturation: 90 %
Patient temperature: 35.6
Patient temperature: 36.7
Patient temperature: 36.5
TCO2: 24 mmol/L (ref 0–100)
TCO2: 23 mmol/L (ref 0–100)
TCO2: 25 mmol/L (ref 0–100)
TCO2: 22 mmol/L (ref 0–100)
TCO2: 23 mmol/L (ref 0–100)
pCO2 arterial: 36.6 mmHg (ref 35.0–45.0)
pCO2 arterial: 37.2 mmHg (ref 35.0–45.0)
pCO2 arterial: 41.1 mmHg (ref 35.0–45.0)
pCO2 arterial: 33.8 mmHg — ABNORMAL LOW (ref 35.0–45.0)
pCO2 arterial: 41.5 mmHg (ref 35.0–45.0)
pH, Arterial: 7.412 (ref 7.350–7.450)
pH, Arterial: 7.368 (ref 7.350–7.450)
pH, Arterial: 7.362 (ref 7.350–7.450)
pH, Arterial: 7.389 (ref 7.350–7.450)
pH, Arterial: 7.319 — ABNORMAL LOW (ref 7.350–7.450)
pO2, Arterial: 343 mmHg — ABNORMAL HIGH (ref 80.0–100.0)
pO2, Arterial: 79 mmHg — ABNORMAL LOW (ref 80.0–100.0)
pO2, Arterial: 89 mmHg (ref 80.0–100.0)
pO2, Arterial: 78 mmHg — ABNORMAL LOW (ref 80.0–100.0)
pO2, Arterial: 62 mmHg — ABNORMAL LOW (ref 80.0–100.0)

## 2014-12-10 LAB — GLUCOSE, CAPILLARY
Glucose-Capillary: 100 mg/dL — ABNORMAL HIGH (ref 65–99)
Glucose-Capillary: 106 mg/dL — ABNORMAL HIGH (ref 65–99)
Glucose-Capillary: 117 mg/dL — ABNORMAL HIGH (ref 65–99)
Glucose-Capillary: 108 mg/dL — ABNORMAL HIGH (ref 65–99)

## 2014-12-10 LAB — POCT I-STAT GLUCOSE
Glucose, Bld: 121 mg/dL — ABNORMAL HIGH (ref 65–99)
Operator id: 156951

## 2014-12-10 LAB — CBC
HCT: 39.7 % (ref 39.0–52.0)
HCT: 38.6 % — ABNORMAL LOW (ref 39.0–52.0)
HEMOGLOBIN: 13.6 g/dL (ref 13.0–17.0)
Hemoglobin: 13.3 g/dL (ref 13.0–17.0)
MCH: 30.2 pg (ref 26.0–34.0)
MCH: 30.4 pg (ref 26.0–34.0)
MCHC: 34.3 g/dL (ref 30.0–36.0)
MCHC: 34.5 g/dL (ref 30.0–36.0)
MCV: 88.2 fL (ref 78.0–100.0)
MCV: 88.3 fL (ref 78.0–100.0)
Platelets: 145 10*3/uL — ABNORMAL LOW (ref 150–400)
Platelets: 140 10*3/uL — ABNORMAL LOW (ref 150–400)
RBC: 4.5 MIL/uL (ref 4.22–5.81)
RBC: 4.37 MIL/uL (ref 4.22–5.81)
RDW: 12.8 % (ref 11.5–15.5)
RDW: 12.7 % (ref 11.5–15.5)
WBC: 15.2 10*3/uL — AB (ref 4.0–10.5)
WBC: 13.5 10*3/uL — ABNORMAL HIGH (ref 4.0–10.5)

## 2014-12-10 LAB — PROTIME-INR
INR: 1.29 (ref 0.00–1.49)
PROTHROMBIN TIME: 16.2 s — AB (ref 11.6–15.2)

## 2014-12-10 LAB — POCT I-STAT 4, (NA,K, GLUC, HGB,HCT)
Glucose, Bld: 122 mg/dL — ABNORMAL HIGH (ref 65–99)
HCT: 39 % (ref 39.0–52.0)
Hemoglobin: 13.3 g/dL (ref 13.0–17.0)
Potassium: 3.6 mmol/L (ref 3.5–5.1)
Sodium: 139 mmol/L (ref 135–145)

## 2014-12-10 LAB — HEMOGLOBIN AND HEMATOCRIT, BLOOD
HCT: 29.1 % — ABNORMAL LOW (ref 39.0–52.0)
Hemoglobin: 9.9 g/dL — ABNORMAL LOW (ref 13.0–17.0)

## 2014-12-10 LAB — CREATININE, SERUM
Creatinine, Ser: 0.93 mg/dL (ref 0.61–1.24)
GFR calc Af Amer: >60 mL/min (ref >60)
GFR calc non Af Amer: >60 mL/min (ref >60)

## 2014-12-10 LAB — APTT: aPTT: 29 seconds (ref 24–37)

## 2014-12-10 LAB — PLATELET COUNT: Platelets: 95 10*3/uL — ABNORMAL LOW (ref 150–400)

## 2014-12-10 LAB — MAGNESIUM: Magnesium: 2.9 mg/dL — ABNORMAL HIGH (ref 1.7–2.4)

## 2014-12-10 SURGERY — CORONARY ARTERY BYPASS GRAFTING (CABG)
Anesthesia: General | Site: Chest

## 2014-12-10 MED ORDER — CHLORHEXIDINE GLUCONATE 0.12 % MT SOLN
15.0000 mL | Freq: Two times a day (BID) | OROMUCOSAL | Status: DC
  Administered 2014-12-10 – 2014-12-15 (×6): 15 mL via OROMUCOSAL
  Filled 2014-12-10 (×11): qty 15

## 2014-12-10 MED ORDER — MIDAZOLAM HCL 2 MG/2ML IJ SOLN
2.0000 mg | INTRAMUSCULAR | Status: DC | PRN
Start: 2014-12-10 — End: 2014-12-11
  Administered 2014-12-10 – 2014-12-11 (×2): 2 mg via INTRAVENOUS
  Filled 2014-12-10 (×2): qty 2

## 2014-12-10 MED ORDER — ACETAMINOPHEN 500 MG PO TABS
1000.0000 mg | ORAL_TABLET | Freq: Four times a day (QID) | ORAL | Status: DC
  Administered 2014-12-11 – 2014-12-13 (×8): 1000 mg via ORAL
  Filled 2014-12-10 (×18): qty 2

## 2014-12-10 MED ORDER — VANCOMYCIN HCL IN DEXTROSE 1-5 GM/200ML-% IV SOLN
1000.0000 mg | Freq: Two times a day (BID) | INTRAVENOUS | Status: AC
  Administered 2014-12-10 – 2014-12-11 (×2): 1000 mg via INTRAVENOUS
  Filled 2014-12-10 (×2): qty 200

## 2014-12-10 MED ORDER — MUPIROCIN 2 % EX OINT
1.0000 "application " | TOPICAL_OINTMENT | Freq: Two times a day (BID) | CUTANEOUS | Status: DC
  Administered 2014-12-11 – 2014-12-15 (×9): 1 via NASAL
  Filled 2014-12-10 (×4): qty 22

## 2014-12-10 MED ORDER — PLASMA-LYTE 148 IV SOLN
INTRAVENOUS | Status: DC | PRN
  Administered 2014-12-10: 500 mL via INTRAVASCULAR

## 2014-12-10 MED ORDER — DOPAMINE-DEXTROSE 3.2-5 MG/ML-% IV SOLN
2.5000 ug/kg/min | INTRAVENOUS | Status: DC
  Administered 2014-12-10: 2 ug/kg/min via INTRAVENOUS

## 2014-12-10 MED ORDER — VECURONIUM BROMIDE 10 MG IV SOLR
INTRAVENOUS | Status: DC | PRN
  Administered 2014-12-10: 2 mg via INTRAVENOUS
  Administered 2014-12-10: 10 mg via INTRAVENOUS
  Administered 2014-12-10: 2 mg via INTRAVENOUS

## 2014-12-10 MED ORDER — LACTATED RINGERS IV SOLN
INTRAVENOUS | Status: DC | PRN
  Administered 2014-12-10 (×2): via INTRAVENOUS

## 2014-12-10 MED ORDER — METOPROLOL TARTRATE 25 MG/10 ML ORAL SUSPENSION
12.5000 mg | Freq: Two times a day (BID) | ORAL | Status: DC
  Filled 2014-12-10 (×3): qty 5

## 2014-12-10 MED ORDER — POTASSIUM CHLORIDE 10 MEQ/50ML IV SOLN
10.0000 meq | INTRAVENOUS | Status: AC
  Administered 2014-12-10 (×3): 10 meq via INTRAVENOUS

## 2014-12-10 MED ORDER — NITROGLYCERIN IN D5W 200-5 MCG/ML-% IV SOLN
0.0000 ug/min | INTRAVENOUS | Status: DC
  Administered 2014-12-10: 5 ug/min via INTRAVENOUS

## 2014-12-10 MED ORDER — ADULT MULTIVITAMIN W/MINERALS CH
1.0000 | ORAL_TABLET | Freq: Every day | ORAL | Status: DC
Start: 2014-12-11 — End: 2014-12-15
  Administered 2014-12-11 – 2014-12-15 (×5): 1 via ORAL
  Filled 2014-12-10 (×5): qty 1

## 2014-12-10 MED ORDER — SUFENTANIL CITRATE 250 MCG/5ML IV SOLN
INTRAVENOUS | Status: AC
  Filled 2014-12-10: qty 5

## 2014-12-10 MED ORDER — ASPIRIN 81 MG PO CHEW
324.0000 mg | CHEWABLE_TABLET | Freq: Every day | ORAL | Status: DC
  Administered 2014-12-11: 324 mg
  Filled 2014-12-10: qty 4

## 2014-12-10 MED ORDER — MIDAZOLAM HCL 5 MG/5ML IJ SOLN
INTRAMUSCULAR | Status: DC | PRN
  Administered 2014-12-10: 3 mg via INTRAVENOUS
  Administered 2014-12-10: 2 mg via INTRAVENOUS
  Administered 2014-12-10: 3 mg via INTRAVENOUS
  Administered 2014-12-10: 2 mg via INTRAVENOUS

## 2014-12-10 MED ORDER — FAMOTIDINE IN NACL 20-0.9 MG/50ML-% IV SOLN
20.0000 mg | Freq: Two times a day (BID) | INTRAVENOUS | Status: AC
  Administered 2014-12-10: 20 mg via INTRAVENOUS

## 2014-12-10 MED ORDER — OXYCODONE HCL 5 MG PO TABS: 5.0000 mg | ORAL_TABLET | ORAL | Status: DC | PRN

## 2014-12-10 MED ORDER — CEFUROXIME SODIUM 1.5 G IJ SOLR
1.5000 g | Freq: Two times a day (BID) | INTRAMUSCULAR | Status: AC
  Administered 2014-12-10 – 2014-12-12 (×4): 1.5 g via INTRAVENOUS
  Filled 2014-12-10 (×4): qty 1.5

## 2014-12-10 MED ORDER — MORPHINE SULFATE 2 MG/ML IJ SOLN
1.0000 mg | INTRAMUSCULAR | Status: AC | PRN
  Administered 2014-12-10: 2 mg via INTRAVENOUS
  Filled 2014-12-10: qty 1

## 2014-12-10 MED ORDER — SODIUM CHLORIDE 0.9 % IV SOLN
10.0000 g | INTRAVENOUS | Status: DC | PRN
  Administered 2014-12-10: 5 g/h via INTRAVENOUS

## 2014-12-10 MED ORDER — VANCOMYCIN HCL IN DEXTROSE 1-5 GM/200ML-% IV SOLN
1000.0000 mg | Freq: Once | INTRAVENOUS | Status: DC
  Filled 2014-12-10: qty 200

## 2014-12-10 MED ORDER — ACETAMINOPHEN 160 MG/5ML PO SOLN: 650.0000 mg | Freq: Once | ORAL | Status: AC

## 2014-12-10 MED ORDER — ARTIFICIAL TEARS OP OINT
TOPICAL_OINTMENT | OPHTHALMIC | Status: DC | PRN
  Administered 2014-12-10: 1 via OPHTHALMIC

## 2014-12-10 MED ORDER — ACETAMINOPHEN 650 MG RE SUPP
650.0000 mg | Freq: Once | RECTAL | Status: AC
  Administered 2014-12-10: 650 mg via RECTAL

## 2014-12-10 MED ORDER — INSULIN REGULAR BOLUS VIA INFUSION
0.0000 [IU] | Freq: Three times a day (TID) | INTRAVENOUS | Status: DC
  Filled 2014-12-10: qty 10

## 2014-12-10 MED ORDER — METOPROLOL TARTRATE 1 MG/ML IV SOLN
2.5000 mg | INTRAVENOUS | Status: DC | PRN
  Administered 2014-12-12: 5 mg via INTRAVENOUS
  Administered 2014-12-12: 2.5 mg via INTRAVENOUS
  Filled 2014-12-10: qty 5

## 2014-12-10 MED ORDER — PROPOFOL 10 MG/ML IV BOLUS
INTRAVENOUS | Status: AC
  Filled 2014-12-10: qty 20

## 2014-12-10 MED ORDER — MIDAZOLAM HCL 10 MG/2ML IJ SOLN
INTRAMUSCULAR | Status: AC
  Filled 2014-12-10: qty 2

## 2014-12-10 MED ORDER — VECURONIUM BROMIDE 10 MG IV SOLR
INTRAVENOUS | Status: AC
  Filled 2014-12-10: qty 10

## 2014-12-10 MED ORDER — DIPHENHYDRAMINE HCL 50 MG/ML IJ SOLN
12.5000 mg | Freq: Once | INTRAMUSCULAR | Status: AC
Start: 2014-12-10 — End: 2014-12-10
  Administered 2014-12-10: 12.5 mg via INTRAVENOUS
  Filled 2014-12-10: qty 1

## 2014-12-10 MED ORDER — INSULIN REGULAR HUMAN 100 UNIT/ML IJ SOLN
INTRAMUSCULAR | Status: DC
  Administered 2014-12-10: 2.5 [IU]/h via INTRAVENOUS
  Filled 2014-12-10: qty 2.5

## 2014-12-10 MED ORDER — SODIUM CHLORIDE 0.9 % IJ SOLN: 3.0000 mL | INTRAMUSCULAR | Status: DC | PRN

## 2014-12-10 MED ORDER — SODIUM CHLORIDE 0.9 % IV SOLN
250.0000 mL | INTRAVENOUS | Status: DC
Start: 2014-12-11 — End: 2014-12-15

## 2014-12-10 MED ORDER — PHENYLEPHRINE HCL 10 MG/ML IJ SOLN
20.0000 mg | INTRAVENOUS | Status: DC | PRN
  Administered 2014-12-10: 50 ug/min via INTRAVENOUS

## 2014-12-10 MED ORDER — METOPROLOL TARTRATE 12.5 MG HALF TABLET
12.5000 mg | ORAL_TABLET | Freq: Two times a day (BID) | ORAL | Status: DC
  Filled 2014-12-10 (×5): qty 1

## 2014-12-10 MED ORDER — SODIUM CHLORIDE 0.9 % IJ SOLN
INTRAMUSCULAR | Status: DC | PRN
  Administered 2014-12-10 (×3): 4 mL via TOPICAL

## 2014-12-10 MED ORDER — BISACODYL 10 MG RE SUPP: 10.0000 mg | Freq: Every day | RECTAL | Status: DC

## 2014-12-10 MED ORDER — 0.9 % SODIUM CHLORIDE (POUR BTL) OPTIME
TOPICAL | Status: DC | PRN
  Administered 2014-12-10: 6000 mL

## 2014-12-10 MED ORDER — PROTAMINE SULFATE 10 MG/ML IV SOLN
INTRAVENOUS | Status: AC
  Filled 2014-12-10: qty 25

## 2014-12-10 MED ORDER — BISACODYL 5 MG PO TBEC
10.0000 mg | DELAYED_RELEASE_TABLET | Freq: Every day | ORAL | Status: DC
  Administered 2014-12-11 – 2014-12-15 (×3): 10 mg via ORAL
  Filled 2014-12-10 (×4): qty 2

## 2014-12-10 MED ORDER — LACTATED RINGERS IV SOLN
500.0000 mL | Freq: Once | INTRAVENOUS | Status: DC | PRN
Start: 2014-12-10 — End: 2014-12-10

## 2014-12-10 MED ORDER — SODIUM CHLORIDE 0.9 % IV SOLN
250.0000 [IU] | INTRAVENOUS | Status: DC | PRN
  Administered 2014-12-10: 1 [IU]/h via INTRAVENOUS

## 2014-12-10 MED ORDER — PHENYLEPHRINE HCL 10 MG/ML IJ SOLN
0.0000 ug/min | INTRAVENOUS | Status: DC
  Filled 2014-12-10: qty 2

## 2014-12-10 MED ORDER — HEPARIN SODIUM (PORCINE) 1000 UNIT/ML IJ SOLN
INTRAMUSCULAR | Status: DC | PRN
  Administered 2014-12-10: 27000 [IU] via INTRAVENOUS
  Administered 2014-12-10: 3000 [IU] via INTRAVENOUS

## 2014-12-10 MED ORDER — LACTATED RINGERS IV SOLN
INTRAVENOUS | Status: DC | PRN
  Administered 2014-12-10: 07:00:00 via INTRAVENOUS

## 2014-12-10 MED ORDER — SODIUM CHLORIDE 0.9 % IV SOLN
5.0000 g/h | INTRAVENOUS | Status: DC
  Filled 2014-12-10: qty 20

## 2014-12-10 MED ORDER — ARTIFICIAL TEARS OP OINT
TOPICAL_OINTMENT | OPHTHALMIC | Status: AC
  Filled 2014-12-10: qty 3.5

## 2014-12-10 MED ORDER — NITROGLYCERIN IN D5W 200-5 MCG/ML-% IV SOLN
INTRAVENOUS | Status: DC | PRN
  Administered 2014-12-10: 5 ug/min via INTRAVENOUS

## 2014-12-10 MED ORDER — SUFENTANIL CITRATE 50 MCG/ML IV SOLN
INTRAVENOUS | Status: DC | PRN
  Administered 2014-12-10: 50 ug via INTRAVENOUS
  Administered 2014-12-10: 20 ug via INTRAVENOUS
  Administered 2014-12-10: 40 ug via INTRAVENOUS
  Administered 2014-12-10: 30 ug via INTRAVENOUS
  Administered 2014-12-10: 50 ug via INTRAVENOUS
  Administered 2014-12-10: 10 ug via INTRAVENOUS

## 2014-12-10 MED ORDER — ROCURONIUM BROMIDE 50 MG/5ML IV SOLN
INTRAVENOUS | Status: AC
  Filled 2014-12-10: qty 1

## 2014-12-10 MED ORDER — PANTOPRAZOLE SODIUM 40 MG PO TBEC
40.0000 mg | DELAYED_RELEASE_TABLET | Freq: Every day | ORAL | Status: DC
  Administered 2014-12-12 – 2014-12-15 (×4): 40 mg via ORAL
  Filled 2014-12-10 (×4): qty 1

## 2014-12-10 MED ORDER — DEXMEDETOMIDINE HCL IN NACL 200 MCG/50ML IV SOLN
0.0000 ug/kg/h | INTRAVENOUS | Status: DC
Start: 2014-12-10 — End: 2014-12-11
  Administered 2014-12-10: 0.7 ug/kg/h via INTRAVENOUS
  Administered 2014-12-10: 0.2 ug/kg/h via INTRAVENOUS
  Filled 2014-12-10 (×2): qty 50

## 2014-12-10 MED ORDER — MORPHINE SULFATE 2 MG/ML IJ SOLN
2.0000 mg | INTRAMUSCULAR | Status: DC | PRN
  Administered 2014-12-10 – 2014-12-11 (×3): 2 mg via INTRAVENOUS
  Filled 2014-12-10 (×4): qty 1

## 2014-12-10 MED ORDER — ROSUVASTATIN CALCIUM 10 MG PO TABS
10.0000 mg | ORAL_TABLET | Freq: Every evening | ORAL | Status: DC
  Administered 2014-12-11 – 2014-12-14 (×4): 10 mg via ORAL
  Filled 2014-12-10 (×6): qty 1

## 2014-12-10 MED ORDER — SODIUM CHLORIDE 0.45 % IV SOLN
INTRAVENOUS | Status: DC | PRN
  Administered 2014-12-10: 15:00:00 via INTRAVENOUS

## 2014-12-10 MED ORDER — PROTAMINE SULFATE 10 MG/ML IV SOLN
INTRAVENOUS | Status: DC | PRN
  Administered 2014-12-10 (×2): 125 mg via INTRAVENOUS

## 2014-12-10 MED ORDER — ALBUMIN HUMAN 5 % IV SOLN
250.0000 mL | INTRAVENOUS | Status: AC | PRN
  Administered 2014-12-10 (×3): 250 mL via INTRAVENOUS
  Filled 2014-12-10: qty 250

## 2014-12-10 MED ORDER — CHLORHEXIDINE GLUCONATE CLOTH 2 % EX PADS
6.0000 | MEDICATED_PAD | Freq: Every day | CUTANEOUS | Status: AC
  Administered 2014-12-10 – 2014-12-14 (×5): 6 via TOPICAL

## 2014-12-10 MED ORDER — TRAMADOL HCL 50 MG PO TABS
50.0000 mg | ORAL_TABLET | ORAL | Status: DC | PRN
  Administered 2014-12-12: 50 mg via ORAL
  Filled 2014-12-10: qty 1

## 2014-12-10 MED ORDER — SODIUM CHLORIDE 0.9 % IV SOLN: INTRAVENOUS | Status: DC

## 2014-12-10 MED ORDER — ASPIRIN EC 325 MG PO TBEC
325.0000 mg | DELAYED_RELEASE_TABLET | Freq: Every day | ORAL | Status: DC
  Administered 2014-12-12 – 2014-12-15 (×4): 325 mg via ORAL
  Filled 2014-12-10 (×5): qty 1

## 2014-12-10 MED ORDER — SUCCINYLCHOLINE CHLORIDE 20 MG/ML IJ SOLN
INTRAMUSCULAR | Status: AC
  Filled 2014-12-10: qty 1

## 2014-12-10 MED ORDER — ACETAMINOPHEN 160 MG/5ML PO SOLN: 1000.0000 mg | Freq: Four times a day (QID) | ORAL | Status: DC

## 2014-12-10 MED ORDER — MAGNESIUM SULFATE 4 GM/100ML IV SOLN
4.0000 g | Freq: Once | INTRAVENOUS | Status: AC
  Administered 2014-12-10: 4 g via INTRAVENOUS
  Filled 2014-12-10: qty 100

## 2014-12-10 MED ORDER — HEMOSTATIC AGENTS (NO CHARGE) OPTIME
TOPICAL | Status: DC | PRN
  Administered 2014-12-10 (×2): 1 via TOPICAL

## 2014-12-10 MED ORDER — PROPOFOL 10 MG/ML IV BOLUS
INTRAVENOUS | Status: DC | PRN
  Administered 2014-12-10: 50 mg via INTRAVENOUS

## 2014-12-10 MED ORDER — SODIUM CHLORIDE 0.9 % IV SOLN
INTRAVENOUS | Status: DC | PRN
Start: 2014-12-10 — End: 2014-12-10
  Administered 2014-12-10: 14:00:00 via INTRAVENOUS

## 2014-12-10 MED ORDER — SODIUM CHLORIDE 0.9 % IJ SOLN
3.0000 mL | Freq: Two times a day (BID) | INTRAMUSCULAR | Status: DC
  Administered 2014-12-11 – 2014-12-13 (×5): 3 mL via INTRAVENOUS

## 2014-12-10 MED ORDER — DOCUSATE SODIUM 100 MG PO CAPS
200.0000 mg | ORAL_CAPSULE | Freq: Every day | ORAL | Status: DC
  Administered 2014-12-11 – 2014-12-15 (×3): 200 mg via ORAL
  Filled 2014-12-10 (×4): qty 2

## 2014-12-10 MED ORDER — METHYLPREDNISOLONE SODIUM SUCC 125 MG IJ SOLR
125.0000 mg | Freq: Once | INTRAMUSCULAR | Status: AC
  Administered 2014-12-10: 125 mg via INTRAVENOUS
  Filled 2014-12-10: qty 2

## 2014-12-10 MED ORDER — LACTATED RINGERS IV SOLN
INTRAVENOUS | Status: DC
  Administered 2014-12-10: 14:00:00 via INTRAVENOUS

## 2014-12-10 MED ORDER — ONDANSETRON HCL 4 MG/2ML IJ SOLN: 4.0000 mg | Freq: Four times a day (QID) | INTRAMUSCULAR | Status: DC | PRN

## 2014-12-10 MED ORDER — CETYLPYRIDINIUM CHLORIDE 0.05 % MT LIQD
7.0000 mL | Freq: Four times a day (QID) | OROMUCOSAL | Status: DC
  Administered 2014-12-10 – 2014-12-14 (×11): 7 mL via OROMUCOSAL

## 2014-12-10 MED ORDER — METHYLPREDNISOLONE SODIUM SUCC 125 MG IJ SOLR: 125.0000 mg | Freq: Every day | INTRAMUSCULAR | Status: AC

## 2014-12-10 MED ORDER — SODIUM CHLORIDE 0.9 % IV SOLN: Freq: Once | INTRAVENOUS | Status: DC

## 2014-12-10 MED ORDER — LACTATED RINGERS IV SOLN
INTRAVENOUS | Status: DC
  Administered 2014-12-10: 16:00:00 via INTRAVENOUS

## 2014-12-10 MED ORDER — MAGNESIUM OXIDE 400 (241.3 MG) MG PO TABS
400.0000 mg | ORAL_TABLET | Freq: Every day | ORAL | Status: DC
  Administered 2014-12-11 – 2014-12-15 (×5): 400 mg via ORAL
  Filled 2014-12-10 (×5): qty 1

## 2014-12-10 MED FILL — Heparin Sodium (Porcine) Inj 1000 Unit/ML: INTRAMUSCULAR | Qty: 20 | Status: AC

## 2014-12-10 MED FILL — Electrolyte-R (PH 7.4) Solution: INTRAVENOUS | Qty: 6000 | Status: AC

## 2014-12-10 MED FILL — Mannitol IV Soln 20%: INTRAVENOUS | Qty: 500 | Status: AC

## 2014-12-10 MED FILL — Lidocaine HCl IV Inj 20 MG/ML: INTRAVENOUS | Qty: 5 | Status: AC

## 2014-12-10 MED FILL — Sodium Bicarbonate IV Soln 8.4%: INTRAVENOUS | Qty: 50 | Status: AC

## 2014-12-10 MED FILL — Sodium Chloride IV Soln 0.9%: INTRAVENOUS | Qty: 3000 | Status: AC

## 2014-12-10 SURGICAL SUPPLY — 95 items
ADAPTER CARDIO PERF ANTE/RETRO (ADAPTER) ×4 IMPLANT
ADPR PRFSN 84XANTGRD RTRGD (ADAPTER) ×2
BAG DECANTER FOR FLEXI CONT (MISCELLANEOUS) ×4 IMPLANT
BANDAGE ELASTIC 4 VELCRO ST LF (GAUZE/BANDAGES/DRESSINGS) ×4 IMPLANT
BANDAGE ELASTIC 6 VELCRO ST LF (GAUZE/BANDAGES/DRESSINGS) ×4 IMPLANT
BASKET HEART  (ORDER IN 25'S) (MISCELLANEOUS) ×1
BASKET HEART (ORDER IN 25'S) (MISCELLANEOUS) ×1
BASKET HEART (ORDER IN 25S) (MISCELLANEOUS) ×2 IMPLANT
BLADE STERNUM SYSTEM 6 (BLADE) ×4 IMPLANT
BLADE SURG 11 STRL SS (BLADE) ×2 IMPLANT
BLADE SURG 12 STRL SS (BLADE) ×4 IMPLANT
BLADE SURG ROTATE 9660 (MISCELLANEOUS) IMPLANT
BNDG GAUZE ELAST 4 BULKY (GAUZE/BANDAGES/DRESSINGS) ×4 IMPLANT
CANISTER SUCTION 2500CC (MISCELLANEOUS) ×4 IMPLANT
CANNULA GUNDRY RCSP 15FR (MISCELLANEOUS) ×4 IMPLANT
CATH CPB KIT VANTRIGT (MISCELLANEOUS) ×4 IMPLANT
CATH ROBINSON RED A/P 18FR (CATHETERS) ×12 IMPLANT
CATH THORACIC 36FR RT ANG (CATHETERS) ×4 IMPLANT
CLIP FOGARTY SPRING 6M (CLIP) ×2 IMPLANT
CLIP TI WIDE RED SMALL 24 (CLIP) ×4 IMPLANT
COVER SURGICAL LIGHT HANDLE (MISCELLANEOUS) ×4 IMPLANT
CRADLE DONUT ADULT HEAD (MISCELLANEOUS) ×4 IMPLANT
DRAIN CHANNEL 32F RND 10.7 FF (WOUND CARE) ×4 IMPLANT
DRAPE CARDIOVASCULAR INCISE (DRAPES) ×4
DRAPE SLUSH/WARMER DISC (DRAPES) ×4 IMPLANT
DRAPE SRG 135X102X78XABS (DRAPES) ×2 IMPLANT
DRSG AQUACEL AG ADV 3.5X14 (GAUZE/BANDAGES/DRESSINGS) ×4 IMPLANT
ELECT BLADE 4.0 EZ CLEAN MEGAD (MISCELLANEOUS) ×4
ELECT BLADE 6.5 EXT (BLADE) ×4 IMPLANT
ELECT CAUTERY BLADE 6.4 (BLADE) ×4 IMPLANT
ELECT REM PT RETURN 9FT ADLT (ELECTROSURGICAL) ×8
ELECTRODE BLDE 4.0 EZ CLN MEGD (MISCELLANEOUS) ×2 IMPLANT
ELECTRODE REM PT RTRN 9FT ADLT (ELECTROSURGICAL) ×4 IMPLANT
GAUZE SPONGE 4X4 12PLY STRL (GAUZE/BANDAGES/DRESSINGS) ×8 IMPLANT
GLOVE BIO SURGEON STRL SZ7.5 (GLOVE) ×12 IMPLANT
GOWN STRL REUS W/ TWL LRG LVL3 (GOWN DISPOSABLE) ×8 IMPLANT
GOWN STRL REUS W/TWL LRG LVL3 (GOWN DISPOSABLE) ×16
HEMOSTAT POWDER SURGIFOAM 1G (HEMOSTASIS) ×12 IMPLANT
HEMOSTAT SURGICEL 2X14 (HEMOSTASIS) ×4 IMPLANT
INSERT FOGARTY XLG (MISCELLANEOUS) IMPLANT
KIT BASIN OR (CUSTOM PROCEDURE TRAY) ×4 IMPLANT
KIT ROOM TURNOVER OR (KITS) ×4 IMPLANT
KIT SUCTION CATH 14FR (SUCTIONS) ×4 IMPLANT
KIT VASOVIEW W/TROCAR VH 2000 (KITS) ×4 IMPLANT
LEAD PACING MYOCARDI (MISCELLANEOUS) ×4 IMPLANT
MARKER GRAFT CORONARY BYPASS (MISCELLANEOUS) ×12 IMPLANT
NS IRRIG 1000ML POUR BTL (IV SOLUTION) ×20 IMPLANT
PACK OPEN HEART (CUSTOM PROCEDURE TRAY) ×4 IMPLANT
PAD ARMBOARD 7.5X6 YLW CONV (MISCELLANEOUS) ×8 IMPLANT
PAD ELECT DEFIB RADIOL ZOLL (MISCELLANEOUS) ×4 IMPLANT
PENCIL BUTTON HOLSTER BLD 10FT (ELECTRODE) ×4 IMPLANT
PUNCH AORTIC ROTATE  4.5MM 8IN (MISCELLANEOUS) ×2 IMPLANT
PUNCH AORTIC ROTATE 4.0MM (MISCELLANEOUS) IMPLANT
PUNCH AORTIC ROTATE 4.5MM 8IN (MISCELLANEOUS) IMPLANT
PUNCH AORTIC ROTATE 5MM 8IN (MISCELLANEOUS) IMPLANT
SET CARDIOPLEGIA MPS 5001102 (MISCELLANEOUS) ×2 IMPLANT
SOLUTION ANTI FOG 6CC (MISCELLANEOUS) ×2 IMPLANT
SPONGE GAUZE 4X4 12PLY STER LF (GAUZE/BANDAGES/DRESSINGS) ×4 IMPLANT
SPONGE LAP 18X18 X RAY DECT (DISPOSABLE) ×2 IMPLANT
SPONGE LAP 4X18 X RAY DECT (DISPOSABLE) ×2 IMPLANT
SURGIFLO W/THROMBIN 8M KIT (HEMOSTASIS) ×4 IMPLANT
SUT BONE WAX W31G (SUTURE) ×4 IMPLANT
SUT MNCRL AB 4-0 PS2 18 (SUTURE) ×2 IMPLANT
SUT PROLENE 3 0 SH DA (SUTURE) IMPLANT
SUT PROLENE 3 0 SH1 36 (SUTURE) IMPLANT
SUT PROLENE 4 0 RB 1 (SUTURE) ×4
SUT PROLENE 4 0 SH DA (SUTURE) ×4 IMPLANT
SUT PROLENE 4-0 RB1 .5 CRCL 36 (SUTURE) ×2 IMPLANT
SUT PROLENE 5 0 C 1 36 (SUTURE) IMPLANT
SUT PROLENE 6 0 C 1 30 (SUTURE) ×6 IMPLANT
SUT PROLENE 6 0 CC (SUTURE) ×18 IMPLANT
SUT PROLENE 8 0 BV175 6 (SUTURE) ×6 IMPLANT
SUT PROLENE BLUE 7 0 (SUTURE) ×8 IMPLANT
SUT PROLENE POLY MONO (SUTURE) ×6 IMPLANT
SUT SILK  1 MH (SUTURE)
SUT SILK 1 MH (SUTURE) IMPLANT
SUT SILK 2 0 SH CR/8 (SUTURE) IMPLANT
SUT SILK 3 0 SH CR/8 (SUTURE) ×2 IMPLANT
SUT STEEL 6MS V (SUTURE) ×8 IMPLANT
SUT STEEL SZ 6 DBL 3X14 BALL (SUTURE) ×4 IMPLANT
SUT VIC AB 1 CTX 36 (SUTURE) ×8
SUT VIC AB 1 CTX36XBRD ANBCTR (SUTURE) ×4 IMPLANT
SUT VIC AB 2-0 CT1 27 (SUTURE) ×4
SUT VIC AB 2-0 CT1 TAPERPNT 27 (SUTURE) IMPLANT
SUT VIC AB 2-0 CTX 27 (SUTURE) IMPLANT
SUT VIC AB 3-0 X1 27 (SUTURE) IMPLANT
SUTURE E-PAK OPEN HEART (SUTURE) ×4 IMPLANT
SYSTEM SAHARA CHEST DRAIN ATS (WOUND CARE) ×4 IMPLANT
TAPE CLOTH SURG 4X10 WHT LF (GAUZE/BANDAGES/DRESSINGS) ×4 IMPLANT
TOWEL OR 17X24 6PK STRL BLUE (TOWEL DISPOSABLE) ×8 IMPLANT
TOWEL OR 17X26 10 PK STRL BLUE (TOWEL DISPOSABLE) ×8 IMPLANT
TRAY FOLEY IC TEMP SENS 16FR (CATHETERS) ×4 IMPLANT
TUBING INSUFFLATION (TUBING) ×4 IMPLANT
UNDERPAD 30X30 INCONTINENT (UNDERPADS AND DIAPERS) ×4 IMPLANT
WATER STERILE IRR 1000ML POUR (IV SOLUTION) ×8 IMPLANT

## 2014-12-10 NOTE — Anesthesia Preprocedure Evaluation (Addendum)
Anesthesia Evaluation  Patient identified by MRN, date of birth, ID band Patient awake    Reviewed: Allergy & Precautions, H&P , NPO status , Patient's Chart, lab work & pertinent test results, reviewed documented beta blocker date and time   Airway Mallampati: III  TM Distance: >3 FB Neck ROM: Full    Dental no notable dental hx. (+) Teeth Intact, Dental Advisory Given   Pulmonary sleep apnea and Continuous Positive Airway Pressure Ventilation ,  breath sounds clear to auscultation  Pulmonary exam normal       Cardiovascular hypertension, Pt. on medications and Pt. on home beta blockers + CAD Rhythm:Regular Rate:Normal     Neuro/Psych negative neurological ROS  negative psych ROS   GI/Hepatic negative GI ROS, Neg liver ROS,   Endo/Other  negative endocrine ROS  Renal/GU negative Renal ROS  negative genitourinary   Musculoskeletal  (+) Arthritis -, Osteoarthritis,    Abdominal   Peds  Hematology negative hematology ROS (+)   Anesthesia Other Findings   Reproductive/Obstetrics negative OB ROS                          Anesthesia Physical Anesthesia Plan  ASA: IV  Anesthesia Plan: General   Post-op Pain Management:    Induction: Intravenous  Airway Management Planned: Oral ETT  Additional Equipment: Arterial line, CVP, PA Cath, TEE and Ultrasound Guidance Line Placement  Intra-op Plan:   Post-operative Plan: Post-operative intubation/ventilation  Informed Consent: I have reviewed the patients History and Physical, chart, labs and discussed the procedure including the risks, benefits and alternatives for the proposed anesthesia with the patient or authorized representative who has indicated his/her understanding and acceptance.   Dental advisory given  Plan Discussed with: CRNA, Anesthesiologist and Surgeon  Anesthesia Plan Comments:        Anesthesia Quick Evaluation

## 2014-12-10 NOTE — Progress Notes (Signed)
NIF -22 cmH20, and VC 1.8L (>10cc/kg).

## 2014-12-10 NOTE — Brief Op Note (Signed)
12/10/2014  12:13 PM  PATIENT:  Victor Mcclure  69 y.o. male  PRE-OPERATIVE DIAGNOSIS:  CAD  POST-OPERATIVE DIAGNOSIS:  CAD  PROCEDURE:  Procedure(s):  CORONARY ARTERY BYPASS GRAFTING x 5 -LIMA to LAD -SVG to DIAGONAL -SVG to OM -SEQUENTIAL SVG TO PDA, PLB  ENDOSCOPIC HARVEST OF THE GREATER SAPHENOUS VEIN -Right Leg  TRANSESOPHAGEAL ECHOCARDIOGRAM (TEE) (N/A)  SURGEON:  Surgeon(s) and Role:    * Ivin Poot, MD - Primary  PHYSICIAN ASSISTANT: Ellwood Handler PA-C  ANESTHESIA:   general  EBL:  Total I/O In: -  Out: 400 [Urine:400]  BLOOD ADMINISTERED:  CELLSAVER, 2 FFP and 2 PLTS  DRAINS: Left Pleural Chest Tube, Mediastinal Chest drains   LOCAL MEDICATIONS USED:  NONE  SPECIMEN:  No Specimen  DISPOSITION OF SPECIMEN:  N/A  COUNTS:  YES  TOURNIQUET:  * No tourniquets in log *  DICTATION: .Dragon Dictation  PLAN OF CARE: Admit to inpatient   PATIENT DISPOSITION:  ICU - intubated and hemodynamically stable.   Delay start of Pharmacological VTE agent (>24hrs) due to surgical blood loss or risk of bleeding: yes

## 2014-12-10 NOTE — Progress Notes (Signed)
Utilization Review Completed.Quinlan Mcfall T6/09/2014  

## 2014-12-10 NOTE — Transfer of Care (Signed)
Immediate Anesthesia Transfer of Care Note  Patient: SANAV REMER  Procedure(s) Performed: Procedure(s): CORONARY ARTERY BYPASS GRAFTING (CABG)x 5 -LIMA to LAD -SVG to DIAGONAL -SVG to OM -SEQUENTIAL SVG TO PDA, PLB (N/A) TRANSESOPHAGEAL ECHOCARDIOGRAM (TEE) (N/A)  Patient Location: ICU  Anesthesia Type:General  Level of Consciousness: unresponsive and Patient remains intubated per anesthesia plan  Airway & Oxygen Therapy: Patient remains intubated per anesthesia plan and Patient placed on Ventilator (see vital sign flow sheet for setting)  Post-op Assessment: Report given to RN and Post -op Vital signs reviewed and stable  Post vital signs: Reviewed and stable  Last Vitals:  Filed Vitals:   12/10/14 0603  BP: 113/68  Pulse: 52  Temp: 36.5 C  Resp: 20    Complications: No apparent anesthesia complications

## 2014-12-10 NOTE — Anesthesia Postprocedure Evaluation (Signed)
  Anesthesia Post-op Note  Patient: Victor Mcclure  Procedure(s) Performed: Procedure(s): CORONARY ARTERY BYPASS GRAFTING (CABG)x 5 -LIMA to LAD -SVG to DIAGONAL -SVG to OM -SEQUENTIAL SVG TO PDA, PLB (N/A) TRANSESOPHAGEAL ECHOCARDIOGRAM (TEE) (N/A)  Patient Location: ICU  Anesthesia Type:General  Level of Consciousness: sedated and unresponsive  Airway and Oxygen Therapy: Patient remains intubated and on ventilator  Post-op Pain: none  Post-op Assessment: Post-op Vital signs reviewed, Patient's Cardiovascular Status Stable and Respiratory Function Stable  Post-op Vital Signs: Reviewed  Filed Vitals:   12/10/14 1600  BP: 110/79  Pulse: 90  Temp: 36.1 C  Resp: 13    Complications: No apparent anesthesia complications

## 2014-12-10 NOTE — Anesthesia Procedure Notes (Addendum)
Procedure Name: Intubation Date/Time: 12/10/2014 7:37 AM Performed by: Melina Copa, Tanay Misuraca R Pre-anesthesia Checklist: Patient identified, Emergency Drugs available, Suction available, Patient being monitored and Timeout performed Patient Re-evaluated:Patient Re-evaluated prior to inductionOxygen Delivery Method: Circle system utilized Preoxygenation: Pre-oxygenation with 100% oxygen Intubation Type: IV induction Ventilation: Mask ventilation without difficulty Laryngoscope Size: Mac and 4 Grade View: Grade I Tube type: Oral Tube size: 8.5 mm Number of attempts: 1 Airway Equipment and Method: Stylet Placement Confirmation: ETT inserted through vocal cords under direct vision,  positive ETCO2 and breath sounds checked- equal and bilateral Secured at: 23 cm Tube secured with: Tape Dental Injury: Teeth and Oropharynx as per pre-operative assessment

## 2014-12-10 NOTE — Progress Notes (Signed)
The patient was examined and preop studies reviewed. There has been no change from the prior exam and the patient is ready for surgery.  Plan CABG on P Majer today

## 2014-12-10 NOTE — Progress Notes (Signed)
TCTS BRIEF SICU PROGRESS NOTE  Day of Surgery  S/P Procedure(s) (LRB): CORONARY ARTERY BYPASS GRAFTING (CABG)x 5 -LIMA to LAD -SVG to DIAGONAL -SVG to OM -SEQUENTIAL SVG TO PDA, PLB (N/A) TRANSESOPHAGEAL ECHOCARDIOGRAM (TEE) (N/A)   Starting to wake on vent AAI paced w/ stable hemodynamics on low dose dopamine Chest tube output low UOP excellent Chest tube output low Labs okay  Plan: Continue routine early postop  Victor Mcclure 12/10/2014 6:44 PM

## 2014-12-10 NOTE — Progress Notes (Signed)
  Echocardiogram Echocardiogram Transesophageal has been performed.  Victor Mcclure 12/10/2014, 9:02 AM

## 2014-12-10 NOTE — Procedures (Signed)
Extubation Procedure Note  Patient Details:   Name: Victor Mcclure DOB: [birth year] MRN: [id number]   Airway Documentation:  Airway 8.5 mm (Active)  Secured at (cm) 23 cm 12/10/2014  6:15 PM  Measured From Lips 12/10/2014  6:15 PM  Secured Location Right 12/10/2014  6:15 PM  Secured By Pink Tape 12/10/2014  6:15 PM  Site Condition Dry 12/10/2014  6:15 PM    Evaluation  O2 sats: stable throughout Complications: No apparent complications Patient did tolerate procedure well. Bilateral Breath Sounds: Clear, Diminished   Yes   Pt extubated to 4lpm [place] humidified 02. Cuff leak present. No stridor detected. Pt able to say his name. IS 796mL. VS WNL. No complications noted.   [place] 12/10/2014, 7:38 PM

## 2014-12-11 ENCOUNTER — Inpatient Hospital Stay (HOSPITAL_COMMUNITY): Payer: PPO

## 2014-12-11 DIAGNOSIS — J95811 Postprocedural pneumothorax: Secondary | ICD-10-CM

## 2014-12-11 LAB — PREPARE PLATELET PHERESIS: Unit division: 0

## 2014-12-11 LAB — POCT I-STAT, CHEM 8
BUN: 16 mg/dL (ref 6–20)
Calcium, Ion: 1.17 mmol/L (ref 1.13–1.30)
Chloride: 101 mmol/L (ref 101–111)
Creatinine, Ser: 1.3 mg/dL — ABNORMAL HIGH (ref 0.61–1.24)
Glucose, Bld: 111 mg/dL — ABNORMAL HIGH (ref 65–99)
HCT: 35 % — ABNORMAL LOW (ref 39.0–52.0)
Hemoglobin: 11.9 g/dL — ABNORMAL LOW (ref 13.0–17.0)
Potassium: 3.8 mmol/L (ref 3.5–5.1)
Sodium: 133 mmol/L — ABNORMAL LOW (ref 135–145)
TCO2: 23 mmol/L (ref 0–100)

## 2014-12-11 LAB — CBC
HCT: 35.2 % — ABNORMAL LOW (ref 39.0–52.0)
HEMATOCRIT: 35.6 % — AB (ref 39.0–52.0)
HEMOGLOBIN: 11.9 g/dL — AB (ref 13.0–17.0)
Hemoglobin: 12.1 g/dL — ABNORMAL LOW (ref 13.0–17.0)
MCH: 30.2 pg (ref 26.0–34.0)
MCH: 29.8 pg (ref 26.0–34.0)
MCHC: 34.4 g/dL (ref 30.0–36.0)
MCHC: 33.4 g/dL (ref 30.0–36.0)
MCV: 87.8 fL (ref 78.0–100.0)
MCV: 89.2 fL (ref 78.0–100.0)
PLATELETS: 125 10*3/uL — AB (ref 150–400)
PLATELETS: 132 10*3/uL — AB (ref 150–400)
RBC: 4.01 MIL/uL — ABNORMAL LOW (ref 4.22–5.81)
RBC: 3.99 MIL/uL — AB (ref 4.22–5.81)
RDW: 12.8 % (ref 11.5–15.5)
RDW: 13 % (ref 11.5–15.5)
WBC: 12.3 10*3/uL — AB (ref 4.0–10.5)
WBC: 13.5 10*3/uL — AB (ref 4.0–10.5)

## 2014-12-11 LAB — GLUCOSE, CAPILLARY
Glucose-Capillary: 105 mg/dL — ABNORMAL HIGH (ref 65–99)
Glucose-Capillary: 103 mg/dL — ABNORMAL HIGH (ref 65–99)
Glucose-Capillary: 94 mg/dL (ref 65–99)
Glucose-Capillary: 84 mg/dL (ref 65–99)
Glucose-Capillary: 107 mg/dL — ABNORMAL HIGH (ref 65–99)

## 2014-12-11 LAB — BASIC METABOLIC PANEL
Anion gap: 8 (ref 5–15)
BUN: 12 mg/dL (ref 6–20)
CALCIUM: 8 mg/dL — AB (ref 8.9–10.3)
CO2: 24 mmol/L (ref 22–32)
Chloride: 105 mmol/L (ref 101–111)
Creatinine, Ser: 0.78 mg/dL (ref 0.61–1.24)
GFR calc Af Amer: >60 mL/min (ref >60)
Glucose, Bld: 126 mg/dL — ABNORMAL HIGH (ref 65–99)
POTASSIUM: 4.2 mmol/L (ref 3.5–5.1)
Sodium: 137 mmol/L (ref 135–145)

## 2014-12-11 LAB — PREPARE FRESH FROZEN PLASMA: Unit division: 0

## 2014-12-11 LAB — CREATININE, SERUM
CREATININE: 1.2 mg/dL (ref 0.61–1.24)
GFR calc Af Amer: >60 mL/min (ref >60)

## 2014-12-11 LAB — MAGNESIUM
MAGNESIUM: 2.1 mg/dL (ref 1.7–2.4)
Magnesium: 2.2 mg/dL (ref 1.7–2.4)

## 2014-12-11 MED ORDER — LIDOCAINE HCL (PF) 1 % IJ SOLN
INTRAMUSCULAR | Status: AC
  Filled 2014-12-11: qty 5

## 2014-12-11 MED ORDER — INSULIN ASPART 100 UNIT/ML ~~LOC~~ SOLN
0.0000 [IU] | SUBCUTANEOUS | Status: DC
  Administered 2014-12-12: 2 [IU] via SUBCUTANEOUS

## 2014-12-11 MED ORDER — LIDOCAINE HCL (PF) 2 % IJ SOLN
10.0000 mL | Freq: Once | INTRAMUSCULAR | Status: AC
  Administered 2014-12-11: 10 mL via INTRADERMAL
  Filled 2014-12-11: qty 10

## 2014-12-11 MED ORDER — INSULIN DETEMIR 100 UNIT/ML ~~LOC~~ SOLN
20.0000 [IU] | Freq: Once | SUBCUTANEOUS | Status: AC
  Administered 2014-12-11: 20 [IU] via SUBCUTANEOUS
  Filled 2014-12-11: qty 0.2

## 2014-12-11 MED ORDER — MIDAZOLAM HCL 2 MG/2ML IJ SOLN
INTRAMUSCULAR | Status: AC
  Filled 2014-12-11: qty 2

## 2014-12-11 MED ORDER — MORPHINE SULFATE 2 MG/ML IJ SOLN
2.0000 mg | INTRAMUSCULAR | Status: DC | PRN
  Administered 2014-12-11 – 2014-12-12 (×2): 2 mg via INTRAVENOUS
  Filled 2014-12-11: qty 1

## 2014-12-11 MED ORDER — KETOROLAC TROMETHAMINE 15 MG/ML IJ SOLN
15.0000 mg | Freq: Four times a day (QID) | INTRAMUSCULAR | Status: AC
  Administered 2014-12-11 – 2014-12-12 (×5): 15 mg via INTRAVENOUS
  Filled 2014-12-11 (×5): qty 1

## 2014-12-11 MED ORDER — FUROSEMIDE 10 MG/ML IJ SOLN
20.0000 mg | Freq: Four times a day (QID) | INTRAMUSCULAR | Status: AC
  Administered 2014-12-11 (×3): 20 mg via INTRAVENOUS
  Filled 2014-12-11 (×2): qty 2

## 2014-12-11 MED ORDER — MORPHINE SULFATE 4 MG/ML IJ SOLN
INTRAMUSCULAR | Status: AC
  Administered 2014-12-11: 4 mg
  Filled 2014-12-11: qty 1

## 2014-12-11 NOTE — Progress Notes (Signed)
Critical regarding CXR displaying right pneumothorax called to Dr. Roxy Manns at [phone]. Chest tube placement materials set up at beside ready for MD arrival. Richardean Sale, RN

## 2014-12-11 NOTE — Op Note (Signed)
Victor Mcclure, Victor Mcclure NO.:  [id number]  MEDICAL RECORD NO.:  [id number]  LOCATION:  2S09C                        FACILITY:  [facility]  PHYSICIAN:  Ivin Poot, M.D.  DATE OF BIRTH:  1946-01-26  DATE OF PROCEDURE: DATE OF DISCHARGE:                              OPERATIVE REPORT   OPERATIONS: 1. Coronary artery bypass grafting x5 (left internal mammary artery to     left anterior descending artery, saphenous vein graft to diagonal,     saphenous vein graft to obtuse marginal 1, sequential saphenous     vein graft to posterior descending and posterolateral branch of the     right coronary artery). 2. Endoscopic harvest of right leg greater saphenous vein.  SURGEON:  Ivin Poot, M.D.  ASSISTANT:  Ellwood Handler, PA-C.  PREOPERATIVE DIAGNOSES:  Severe three-vessel coronary artery disease, positive stress test, class III angina.  POSTOPERATIVE DIAGNOSES:  Severe three-vessel coronary artery disease, positive stress test, class III angina.  ANESTHESIA:  General by Dr. Oren Bracket.  INDICATIONS:  The patient is a 69 year old Caucasian male with risk factors including hypertension, hyperlipidemia and obesity and family history for coronary artery disease.  He developed exertional symptoms of shortness of breath and decreased in exercise tolerance and a stress test was positive.  Cardiac catheterization demonstrated severe 3-vessel coronary artery disease with nearly occluded right coronary artery and severe disease of the LAD and circumflex.  His LVEF was preserved.  He was felt to be a candidate for multivessel coronary artery bypass grafting.  Prior to surgery, I examined the patient in the office and reviewed the results of his cardiac catheterization and echocardiogram.  I discussed with him the indications and expected benefits of coronary artery bypass grafting as well as the alternatives and the expected postoperative recovery.  I reviewed with  the patient and his girlfriend the major risks of the operation including risks of MI, stroke, bleeding, blood transfusion requirement, infection, postoperative pulmonary problems including pleural effusion, infection, and death.  After reviewing these issues, he demonstrated his understanding and agreed to proceed with surgery under what I felt was an informed consent.  OPERATIVE FINDINGS: 1. Severely diseased LAD and right coronary arteries, suboptimal     targets. 2. Small vein, but adequate for use. 3. No blood cell products required for the surgery.  OPERATIVE PROCEDURE:  The patient was brought to the operating room and placed supine on the operating room table where general anesthesia was induced under invasive hemodynamic monitoring.  A transesophageal echoprobe was placed by the anesthesiologist.  The patient was prepped and draped as a sterile field.  A proper time-out was performed.  A sternotomy was performed as the saphenous vein was harvested endoscopically from the right leg.  The left internal mammary artery was harvested as a pedicle graft from its origin at the subclavian vessels. It was 1.5-mm vessel with good flow.  The sternal retractor was placed, and the pericardium was opened and suspended.  Pursestrings were placed in ascending aorta and right atrium.  Heparin was administered and when the ACT was documented as being therapeutic, the patient was cannulated and placed on cardiopulmonary bypass.  The coronaries were  identified for grafting and the mammary artery and vein grafts were prepared for the distal anastomoses.  Cardioplegia cannulas were placed for both antegrade and retrograde cold blood cardioplegia.  The patient was cooled to 32 degrees and aortic crossclamp was applied.  One liter of cold blood cardioplegia was delivered in split doses between the antegrade aortic and retrograde coronary sinus catheters.  There was good cardioplegic arrest and  septal temperature dropped less than 14 degrees.  Cardioplegia was delivered every 20 minutes or less while crossclamp was in place.  The distal coronary anastomoses were performed.  The first distal anastomoses consisted of the sequential vein graft to the posterior descending and posterolateral.  The posterior descending was a small 1.2-mm vessel with high-grade proximal stenosis.  A side-to- side anastomosis with the vein was sewn using running 7-0 Prolene and there was good flow through the graft.  The sequential vein was extended to the posterolateral branch of the right coronary artery.  This was a large 1.5-mm vessel with high-grade proximal stenosis.  The end of vein was sewn end-to-side to the posterolateral artery with running 7-0 Prolene.  There was good flow through the graft.  Cardioplegia was redosed.  The third distal anastomosis was to the OM branch of the left circumflex.  This was a large 1.5-mm vessel with proximal 80% stenosis. A reverse saphenous vein was sewn end-to-side with running 7-0 Prolene with good flow through the graft.  Cardioplegia was redosed.  The fourth distal anastomosis was to the first large diagonal off the LAD.  It had a proximal 80% stenosis.  A reverse saphenous vein was sewn end-to-side with running 7-0 Prolene with good flow through the graft.  The fifth distal anastomosis was to the distal LAD and had high-grade 90% proximal stenosis.  The left IMA pedicle was brought through an opening in the left lateral pericardium, was brought down onto the LAD and sewn end-to-side with running 8-0 Prolene.  There was good flow through the anastomosis after briefly releasing the pedicle bulldog and the mammary artery.  The bulldog was reapplied and the pedicle was secured to the epicardium with 6-0 Prolene.  Cardioplegia was redosed.  The crossclamp was still in place 3 proximal and vein anastomoses were performed on the ascending aorta with a  4.5-mm punch with running 6-0 Prolene.  Prior to tying down the final proximal anastomosis, air was vented from the coronaries with a dose of retrograde warm blood cardioplegia.  The heart resumed a spontaneous rhythm after removal of the crossclamp. The vein grafts were de-aired and opened, each had good flow and hemostasis was documented at the proximal and distal sites.  The cardioplegia cannulas were removed.  The patient was rewarmed and reperfused.  Temporary pacing wires were applied.  The lungs were expanded and ventilator was resumed.  The patient was then weaned from cardiopulmonary bypass without difficulty.  Echo showed good global LV function.  Cardiac output was normal.  Protamine was administered without adverse reaction.  The patient still had diffuse coagulopathy after reversal of the heparin.  The patient's platelet count was low at 90,000.  He was given a unit of platelets with improved coagulation function.  The superior pericardial fat was closed over the aorta.  Anterior mediastinal and left pleural chest tube were placed and brought out through separate incisions.  The sternum was closed with interrupted steel wire.  The pectoralis fascia was closed with a running #1 Vicryl. The subcutaneous and skin layers were closed using  running Vicryl and sterile dressings were applied.  Total cardiopulmonary bypass time was 172 minutes.     Ivin Poot, M.D.     PV/MEDQ  D:  12/10/2014  T:  12/11/2014  Job:  [id number]  cc:   Laverda Page, MD

## 2014-12-11 NOTE — Progress Notes (Signed)
[facility] BRIEF SICU PROGRESS NOTE  1 Day Post-Op  S/P Procedure(s) (LRB): CORONARY ARTERY BYPASS GRAFTING (CABG)x 5 -LIMA to LAD -SVG to DIAGONAL -SVG to OM -SEQUENTIAL SVG TO PDA, PLB (N/A) TRANSESOPHAGEAL ECHOCARDIOGRAM (TEE) (N/A)   Stable day Soreness improved NSR w/ stable BP Repeat pCXR w/ resolution of right PTX  Plan: Continue current plan  Rexene Alberts 12/11/2014 6:30 PM

## 2014-12-11 NOTE — Progress Notes (Signed)
[address]       [place] [address]             [phone]        CARDIOTHORACIC SURGERY PROGRESS NOTE   R1 Day Post-Op Procedure(s) (LRB): CORONARY ARTERY BYPASS GRAFTING (CABG)x 5 -LIMA to LAD -SVG to DIAGONAL -SVG to OM -SEQUENTIAL SVG TO PDA, PLB (N/A) TRANSESOPHAGEAL ECHOCARDIOGRAM (TEE) (N/A)  Subjective: Feels sore in chest.  Hurts to breath.  Denies SOB at rest.  No nausea  Objective: Vital signs: BP Readings from Last 1 Encounters:  12/11/14 99/72   Pulse Readings from Last 1 Encounters:  12/11/14 89   Resp Readings from Last 1 Encounters:  12/11/14 18   Temp Readings from Last 1 Encounters:  12/11/14 97.9 F (36.6 C)     Hemodynamics: PAP: (19-44)/(10-29) 34/24 mmHg CO:  [4.2 L/min-5.9 L/min] 5.6 L/min CI:  [1.9 L/min/m2-2.7 L/min/m2] 2.6 L/min/m2  Physical Exam:  Rhythm:   Sinus - AAI paced  Breath sounds: Diminished on right side  Heart sounds:  RRR  Incisions:  Dressings dry, intact  Abdomen:  Soft, non-distended, non-tender  Extremities:  Warm, well-perfused  Chest tubes:  Low volume thin serosanguinous output, no air leak    Intake/Output from previous day: 06/03 0701 - 06/04 0700 In: 6803.1 [I.V.:4388.1; Blood:1085; NG/GT:30; IV Piggyback:1300] Out: 5366 [Urine:4475; Blood:1200; Chest Tube:460] Intake/Output this shift:    Lab Results:  CBC: Recent Labs  12/10/14 1900 12/10/14 1939 12/11/14 0440  WBC 13.5*  --  12.3*  HGB 13.3 13.6 12.1*  HCT 38.6* 40.0 35.2*  PLT 140*  --  125*    BMET:  Recent Labs  12/08/14 1317  12/10/14 1939 12/11/14 0440  NA 139  < > 137 137  K 4.2  < > 4.6 4.2  CL 108  < > 104 105  CO2 20*  --   --  24  GLUCOSE 91  < > 144* 126*  BUN 23*  < > 15 12  CREATININE 1.18  < > 0.90 0.78  CALCIUM 9.3  --   --  8.0*  < > = values in this interval not displayed.   CBG (last 3)   Recent Labs  12/10/14 1701 12/10/14 1802 12/10/14 1901  GLUCAP 106* 117* 108*    ABG      Component Value Date/Time   PHART 7.319* 12/10/2014 2154   PCO2ART 41.5 12/10/2014 2154   PO2ART 62.0* 12/10/2014 2154   HCO3 21.5 12/10/2014 2154   TCO2 23 12/10/2014 2154   ACIDBASEDEF 5.0* 12/10/2014 2154   O2SAT 90.0 12/10/2014 2154    CXR:  PORTABLE CHEST - 1 VIEW  COMPARISON: One-view chest 12/10/2014  FINDINGS: A large right-sided pneumothorax is now present. There is coronal scratch the there is collapse of the right middle lobe.  Patient is been extubated. The Swan-Ganz catheter is stable in position. The NG tube has been removed. A left-sided chest tube remains. A left pleural effusion is similar to the prior study. Left-sided edema is slightly increased compared with the prior exam.  IMPRESSION: 1. Large right-sided pneumothorax involving 50% of the hemi thorax. 2. Interval extubation and removal of NG tube. 3. Slight increase in left-sided pleural effusion and edema. Critical Value/emergent results were called by telephone at the time of interpretation on 12/11/2014 at 7:44 am to Markus Daft, RN , who verbally acknowledged these results.   Electronically Signed  By: San Morelle M.D.  On: 12/11/2014 07:45  Assessment/Plan: S/P Procedure(s) (LRB): CORONARY ARTERY BYPASS GRAFTING (CABG)x 5 -LIMA to LAD -SVG to DIAGONAL -SVG to OM -SEQUENTIAL SVG TO PDA, PLB (N/A) TRANSESOPHAGEAL ECHOCARDIOGRAM (TEE) (N/A)  Overall stable POD1 Moderate-large right pneumothorax Maintaining NSR w/ stable hemodynamics Expected post op acute blood loss anemia, very mild Expected post op volume excess Expected post op atelectasis, mild Obesity OSA   Needs chest tube placement  Mobilize  D/C swan-ganz  Diuresis   Rexene Alberts 12/11/2014 8:45 AM

## 2014-12-11 NOTE — Op Note (Signed)
CARDIOTHORACIC SURGERY OPERATIVE NOTE  Date of Procedure:  12/11/2014  Preoperative Diagnosis: Right Pneumothorax  Postoperative Diagnosis: Same  Procedure:   Right chest tube placement  Surgeon:   Valentina Gu. Roxy Manns, MD  Anesthesia: 1% lidocaine local with intravenous sedation    DETAILS OF THE OPERATIVE PROCEDURE  Following full informed consent the patient was given midazolam 2 mg intravenously and continuously monitored for rhythm, BP and oxygen saturation. The right anterior chest was prepared and draped in a sterile manner. 1% lidocaine was utilized to anesthetize the skin and subcutaneous tissues. A small incision was made and a 20 French straight chest tube was placed through the incision into the pleural space. The tube was secured to the skin and connected to a closed suction collection device. The patient tolerated the procedure well. A portable CXR was ordered. There were no complications.    Valentina Gu. Roxy Manns, MD 12/11/2014 9:16 AM

## 2014-12-12 ENCOUNTER — Inpatient Hospital Stay (HOSPITAL_COMMUNITY): Payer: PPO

## 2014-12-12 LAB — GLUCOSE, CAPILLARY
Glucose-Capillary: 105 mg/dL — ABNORMAL HIGH (ref 65–99)
Glucose-Capillary: 104 mg/dL — ABNORMAL HIGH (ref 65–99)
Glucose-Capillary: 106 mg/dL — ABNORMAL HIGH (ref 65–99)
Glucose-Capillary: 110 mg/dL — ABNORMAL HIGH (ref 65–99)
Glucose-Capillary: 111 mg/dL — ABNORMAL HIGH (ref 65–99)
Glucose-Capillary: 113 mg/dL — ABNORMAL HIGH (ref 65–99)
Glucose-Capillary: 116 mg/dL — ABNORMAL HIGH (ref 65–99)
Glucose-Capillary: 120 mg/dL — ABNORMAL HIGH (ref 65–99)
Glucose-Capillary: 120 mg/dL — ABNORMAL HIGH (ref 65–99)
Glucose-Capillary: 122 mg/dL — ABNORMAL HIGH (ref 65–99)
Glucose-Capillary: 127 mg/dL — ABNORMAL HIGH (ref 65–99)
Glucose-Capillary: 152 mg/dL — ABNORMAL HIGH (ref 65–99)
Glucose-Capillary: 99 mg/dL (ref 65–99)
Glucose-Capillary: 127 mg/dL — ABNORMAL HIGH (ref 65–99)
Glucose-Capillary: 93 mg/dL (ref 65–99)
Glucose-Capillary: 95 mg/dL (ref 65–99)
Glucose-Capillary: 118 mg/dL — ABNORMAL HIGH (ref 65–99)

## 2014-12-12 LAB — BASIC METABOLIC PANEL
ANION GAP: 7 (ref 5–15)
BUN: 20 mg/dL (ref 6–20)
CO2: 28 mmol/L (ref 22–32)
Calcium: 8.3 mg/dL — ABNORMAL LOW (ref 8.9–10.3)
Chloride: 102 mmol/L (ref 101–111)
Creatinine, Ser: 1.29 mg/dL — ABNORMAL HIGH (ref 0.61–1.24)
GFR calc Af Amer: >60 mL/min (ref >60)
GFR calc non Af Amer: 56 mL/min — ABNORMAL LOW (ref >60)
Glucose, Bld: 118 mg/dL — ABNORMAL HIGH (ref 65–99)
POTASSIUM: 4.1 mmol/L (ref 3.5–5.1)
SODIUM: 137 mmol/L (ref 135–145)

## 2014-12-12 LAB — CBC
HCT: 34.2 % — ABNORMAL LOW (ref 39.0–52.0)
HEMOGLOBIN: 11.4 g/dL — AB (ref 13.0–17.0)
MCH: 30.2 pg (ref 26.0–34.0)
MCHC: 33.3 g/dL (ref 30.0–36.0)
MCV: 90.7 fL (ref 78.0–100.0)
Platelets: 108 10*3/uL — ABNORMAL LOW (ref 150–400)
RBC: 3.77 MIL/uL — ABNORMAL LOW (ref 4.22–5.81)
RDW: 13.1 % (ref 11.5–15.5)
WBC: 10.9 10*3/uL — ABNORMAL HIGH (ref 4.0–10.5)

## 2014-12-12 MED ORDER — SODIUM CHLORIDE 0.9 % IV SOLN: 250.0000 mL | INTRAVENOUS | Status: DC | PRN

## 2014-12-12 MED ORDER — AMIODARONE HCL IN DEXTROSE 360-4.14 MG/200ML-% IV SOLN
60.0000 mg/h | INTRAVENOUS | Status: DC
  Administered 2014-12-12: 60 mg/h via INTRAVENOUS
  Filled 2014-12-12 (×3): qty 200

## 2014-12-12 MED ORDER — AMIODARONE HCL IN DEXTROSE 360-4.14 MG/200ML-% IV SOLN
30.0000 mg/h | INTRAVENOUS | Status: DC
  Administered 2014-12-13: 30 mg/h via INTRAVENOUS
  Filled 2014-12-12 (×2): qty 200

## 2014-12-12 MED ORDER — MOVING RIGHT ALONG BOOK
Freq: Once | Status: AC
  Administered 2014-12-12: 1
  Filled 2014-12-12: qty 1

## 2014-12-12 MED ORDER — POTASSIUM CHLORIDE CRYS ER 20 MEQ PO TBCR
20.0000 meq | EXTENDED_RELEASE_TABLET | Freq: Every day | ORAL | Status: DC
  Administered 2014-12-12 – 2014-12-15 (×4): 20 meq via ORAL
  Filled 2014-12-12 (×4): qty 1

## 2014-12-12 MED ORDER — INSULIN ASPART 100 UNIT/ML ~~LOC~~ SOLN: 0.0000 [IU] | Freq: Three times a day (TID) | SUBCUTANEOUS | Status: DC

## 2014-12-12 MED ORDER — SODIUM CHLORIDE 0.9 % IJ SOLN
3.0000 mL | Freq: Two times a day (BID) | INTRAMUSCULAR | Status: DC
  Administered 2014-12-12 – 2014-12-15 (×5): 3 mL via INTRAVENOUS

## 2014-12-12 MED ORDER — SODIUM CHLORIDE 0.9 % IJ SOLN
3.0000 mL | INTRAMUSCULAR | Status: DC | PRN
  Administered 2014-12-13: 3 mL via INTRAVENOUS
  Filled 2014-12-12: qty 3

## 2014-12-12 MED ORDER — FUROSEMIDE 40 MG PO TABS
40.0000 mg | ORAL_TABLET | Freq: Every day | ORAL | Status: DC
  Administered 2014-12-12 – 2014-12-15 (×4): 40 mg via ORAL
  Filled 2014-12-12 (×4): qty 1

## 2014-12-12 MED ORDER — METOPROLOL TARTRATE 25 MG PO TABS
25.0000 mg | ORAL_TABLET | Freq: Two times a day (BID) | ORAL | Status: DC
  Administered 2014-12-12 – 2014-12-15 (×6): 25 mg via ORAL
  Filled 2014-12-12 (×7): qty 1

## 2014-12-12 MED ORDER — AMIODARONE LOAD VIA INFUSION
150.0000 mg | Freq: Once | INTRAVENOUS | Status: AC
  Administered 2014-12-12: 150 mg via INTRAVENOUS
  Filled 2014-12-12: qty 83.34

## 2014-12-12 NOTE — Progress Notes (Signed)
Pt heart rate remains in 110's afib. Called Dr. Roxy Manns made him aware of pt heart rate and rhythm change. Orders given to start amiodarone. Monitoring will continue.

## 2014-12-12 NOTE — Progress Notes (Signed)
      [address]       [place] [address]             [phone]        CARDIOTHORACIC SURGERY PROGRESS NOTE   R2 Days Post-Op Procedure(s) (LRB): CORONARY ARTERY BYPASS GRAFTING (CABG)x 5 -LIMA to LAD -SVG to DIAGONAL -SVG to OM -SEQUENTIAL SVG TO PDA, PLB (N/A) TRANSESOPHAGEAL ECHOCARDIOGRAM (TEE) (N/A)  Subjective: Feels well.  Soreness improved  Objective: Vital signs: BP Readings from Last 1 Encounters:  12/12/14 131/84   Pulse Readings from Last 1 Encounters:  12/12/14 65   Resp Readings from Last 1 Encounters:  12/12/14 12   Temp Readings from Last 1 Encounters:  12/12/14 99.1 F (37.3 C) Oral    Hemodynamics:    Physical Exam:  Rhythm:   sinus  Breath sounds: clear  Heart sounds:  RRR  Incisions:  Dressing dry, intact  Abdomen:  Soft, non-distended, non-tender  Extremities:  Warm, well-perfused  Chest tubes:  Low volume thin serosanguinous output, no air leak    Intake/Output from previous day: 06/04 0701 - 06/05 0700 In: 812.3 [P.O.:720; I.V.:42.3; IV Piggyback:50] Out: 2420 [Urine:2150; Chest Tube:270] Intake/Output this shift: Total I/O In: -  Out: 90 [Urine:70; Chest Tube:20]  Lab Results:  CBC: Recent Labs  12/11/14 1707 12/12/14 0415  WBC 13.5* 10.9*  HGB 11.9* 11.4*  HCT 35.6* 34.2*  PLT 132* 108*    BMET:  Recent Labs  12/11/14 0440 12/11/14 1659 12/11/14 1707 12/12/14 0415  NA 137 133*  --  137  K 4.2 3.8  --  4.1  CL 105 101  --  102  CO2 24  --   --  28  GLUCOSE 126* 111*  --  118*  BUN 12 16  --  20  CREATININE 0.78 1.30* 1.20 1.29*  CALCIUM 8.0*  --   --  8.3*     PT/INR:   Recent Labs  12/10/14 1500  LABPROT 16.2*  INR 1.29    CBG (last 3)   Recent Labs  12/11/14 2308 12/12/14 0350 12/12/14 0755  GLUCAP 99 105* 127*    ABG    Component Value Date/Time   PHART 7.319* 12/10/2014 2154   PCO2ART 41.5 12/10/2014 2154   PO2ART 62.0* 12/10/2014 2154   HCO3 21.5 12/10/2014  2154   TCO2 23 12/11/2014 1659   ACIDBASEDEF 5.0* 12/10/2014 2154   O2SAT 90.0 12/10/2014 2154    CXR: PORTABLE CHEST - 1 VIEW  COMPARISON: 12/11/2014  FINDINGS: Bilateral chest tubes remain in place and no definite residual pneumothorax visualized. There is persistent atelectasis or infiltrate in the left retrocardiac lung base which is unchanged. Decreased atelectasis seen in the right lung base. Mild cardiomegaly remains stable.  IMPRESSION: No pneumothorax visualized.  Decreased right basilar atelectasis. No significant change in left retrocardiac opacity.   Electronically Signed  By: Earle Gell M.D.  On: 12/12/2014 10:00   Assessment/Plan: S/P Procedure(s) (LRB): CORONARY ARTERY BYPASS GRAFTING (CABG)x 5 -LIMA to LAD -SVG to DIAGONAL -SVG to OM -SEQUENTIAL SVG TO PDA, PLB (N/A) TRANSESOPHAGEAL ECHOCARDIOGRAM (TEE) (N/A)  Overall stable POD2 Right pneumothorax resolved Maintaining NSR w/ stable BP Expected post op acute blood loss anemia, very mild Expected post op volume excess Expected post op atelectasis, mild Obesity OSA   D/C mediastinal tubes and left pleural tube, right tube to water seal  Mobilize  Diuresis  Transfer step down  Rexene Alberts 12/12/2014 10:02 AM

## 2014-12-12 NOTE — Progress Notes (Addendum)
Pt heart rate converted to afib rat in 110'120's pt was given po 25mg  metoprolol( sch med). ekg obtained. Pt remained in afib with heart rate now in 120's and 130's sustained prn 5mg  metoprolol given. Monitoring will continue. bp 124/80

## 2014-12-12 NOTE — Progress Notes (Signed)
Pt wanting to ambulate at this time; HR 70; pt encouraged to sit up in chair at this time and RN continue to watch HR given 2 recent burst of a-fib; will cont. To monitor.

## 2014-12-12 NOTE — Progress Notes (Signed)
Pt with run of a-fib HR up to 144 while pt up to BR; pt assisted to chair; HR down to 80's; pt with 2nd short run a-fib HR up to 146; HR down to 70 within seconds; pt asymptomatic; will cont. To monitor.

## 2014-12-13 ENCOUNTER — Other Ambulatory Visit: Payer: Self-pay

## 2014-12-13 ENCOUNTER — Encounter (HOSPITAL_COMMUNITY): Payer: Self-pay | Admitting: Cardiothoracic Surgery

## 2014-12-13 ENCOUNTER — Inpatient Hospital Stay (HOSPITAL_COMMUNITY): Payer: PPO

## 2014-12-13 LAB — CBC
HEMATOCRIT: 32.5 % — AB (ref 39.0–52.0)
HEMOGLOBIN: 10.6 g/dL — AB (ref 13.0–17.0)
MCH: 29.5 pg (ref 26.0–34.0)
MCHC: 32.6 g/dL (ref 30.0–36.0)
MCV: 90.5 fL (ref 78.0–100.0)
PLATELETS: 120 10*3/uL — AB (ref 150–400)
RBC: 3.59 MIL/uL — AB (ref 4.22–5.81)
RDW: 13.1 % (ref 11.5–15.5)
WBC: 9.6 10*3/uL (ref 4.0–10.5)

## 2014-12-13 LAB — BASIC METABOLIC PANEL
ANION GAP: 9 (ref 5–15)
BUN: 20 mg/dL (ref 6–20)
CALCIUM: 8.1 mg/dL — AB (ref 8.9–10.3)
CO2: 25 mmol/L (ref 22–32)
Chloride: 103 mmol/L (ref 101–111)
Creatinine, Ser: 1.2 mg/dL (ref 0.61–1.24)
GFR calc Af Amer: >60 mL/min (ref >60)
GFR calc non Af Amer: >60 mL/min (ref >60)
Glucose, Bld: 111 mg/dL — ABNORMAL HIGH (ref 65–99)
POTASSIUM: 4.2 mmol/L (ref 3.5–5.1)
Sodium: 137 mmol/L (ref 135–145)

## 2014-12-13 LAB — GLUCOSE, CAPILLARY
Glucose-Capillary: 97 mg/dL (ref 65–99)
Glucose-Capillary: 99 mg/dL (ref 65–99)
Glucose-Capillary: 97 mg/dL (ref 65–99)

## 2014-12-13 MED ORDER — AMIODARONE HCL 200 MG PO TABS
400.0000 mg | ORAL_TABLET | Freq: Two times a day (BID) | ORAL | Status: DC
  Administered 2014-12-13 – 2014-12-15 (×5): 400 mg via ORAL
  Filled 2014-12-13 (×6): qty 2

## 2014-12-13 MED ORDER — LOSARTAN POTASSIUM 25 MG PO TABS
25.0000 mg | ORAL_TABLET | Freq: Every day | ORAL | Status: DC
Start: 2014-12-13 — End: 2014-12-15
  Administered 2014-12-13 – 2014-12-15 (×3): 25 mg via ORAL
  Filled 2014-12-13 (×3): qty 1

## 2014-12-13 NOTE — Progress Notes (Signed)
CARDIAC REHAB PHASE I   PRE:  Rate/Rhythm: 61 SR  BP:  Supine:   Sitting: 107/76  Standing:    SaO2: 95%RA  MODE:  Ambulation: 550 ft   POST:  Rate/Rhythm: 71 SR  BP:  Supine:   Sitting: 137/77  Standing:    SaO2: 98%RA 0920-0948 Pt walked 550 ft on RA with rolling walker and asst x 1with steady gait. Did not need to rest. Tolerated well. Remained in NSR. To recliner after walk.   Graylon Good, RN BSN  12/13/2014 9:45 AM

## 2014-12-13 NOTE — Progress Notes (Addendum)
      [address]       ,[place] [address]             [phone]      3 Days Post-Op Procedure(s) (LRB): CORONARY ARTERY BYPASS GRAFTING (CABG)x 5 -LIMA to LAD -SVG to DIAGONAL -SVG to OM -SEQUENTIAL SVG TO PDA, PLB (N/A) TRANSESOPHAGEAL ECHOCARDIOGRAM (TEE) (N/A)   Subjective:  [name] has no complaints this morning.  Developed Atrial Fibrillation last night. + ambulation  + BM  Objective: Vital signs in last 24 hours: Temp:  [98.6 F (37 C)-99.1 F (37.3 C)] 98.6 F (37 C) (06/06 0535) Pulse Rate:  [65-125] 85 (06/06 0535) Cardiac Rhythm:  [-] Normal sinus rhythm (06/06 0535) Resp:  [12-21] 20 (06/06 0535) BP: (106-144)/(59-90) 144/89 mmHg (06/06 0535) SpO2:  [94 %-100 %] 100 % (06/06 0535) Weight:  [226 lb 1.6 oz (102.558 kg)] 226 lb 1.6 oz (102.558 kg) (06/06 0500)  Intake/Output from previous day: 06/05 0701 - 06/06 0700 In: 400 [P.O.:400] Out: 1780 [Urine:1520; Chest Tube:260]  General appearance: alert, cooperative and no distress Heart: regular rate and rhythm Lungs: clear to auscultation bilaterally Abdomen: soft, non-tender; bowel sounds normal; no masses,  no organomegaly Extremities: edema trace Wound: clean ad dry  Lab Results:  Recent Labs  12/12/14 0415 12/13/14 0407  WBC 10.9* 9.6  HGB 11.4* 10.6*  HCT 34.2* 32.5*  PLT 108* 120*   BMET:  Recent Labs  12/12/14 0415 12/13/14 0407  NA 137 137  K 4.1 4.2  CL 102 103  CO2 28 25  GLUCOSE 118* 111*  BUN 20 20  CREATININE 1.29* 1.20  CALCIUM 8.3* 8.1*    PT/INR:  Recent Labs  12/10/14 1500  LABPROT 16.2*  INR 1.29   ABG    Component Value Date/Time   PHART 7.319* 12/10/2014 2154   HCO3 21.5 12/10/2014 2154   TCO2 23 12/11/2014 1659   ACIDBASEDEF 5.0* 12/10/2014 2154   O2SAT 90.0 12/10/2014 2154   CBG (last 3)   Recent Labs  12/12/14 1215 12/12/14 1616 12/12/14 2123  GLUCAP 93 95 118*    Assessment/Plan: S/P Procedure(s) (LRB): CORONARY  ARTERY BYPASS GRAFTING (CABG)x 5 -LIMA to LAD -SVG to DIAGONAL -SVG to OM -SEQUENTIAL SVG TO PDA, PLB (N/A) TRANSESOPHAGEAL ECHOCARDIOGRAM (TEE) (N/A)  1. CV- Previous A. Fib, currently NSR- will start oral Amiodarone, continue Lorpessor, start Cozaar for BP 2. Pulm- off oxygen, no acute issues, CT with 285 cc output(box at 40cc) this morning, will leave in place today 3. Renal- creatinine mildly elevated at 1.20, minimal volume overload, continue Lasix for now 4. CBGs controlled, patient not a diabetic will d/c SSIP and glucose checks 5. Dispo- patient stable, currently maintaining NSR, can hopefully d/c pacing wires and chest tube tomorrow   LOS: 3 days    Victor Mcclure, Victor Mcclure 12/13/2014  Place chest tube to water seal Donot plan postop coumadin at this time Aim for DC home Wed  patient examined and medical record reviewed,agree with above note. Tharon Aquas Trigt III 12/13/2014

## 2014-12-13 NOTE — Progress Notes (Signed)
Nursing note Patient ambulated in hallway x1 assist with walker 900 feet. Pt tolerated well with out complaints. Back in chair call bell within reach. Latrish Mogel, Bettina Gavia RN

## 2014-12-13 NOTE — Progress Notes (Signed)
Pt ambulated in hallway approximately 435TP without complications. Monitoring will continue.

## 2014-12-13 NOTE — Progress Notes (Signed)
UR Completed. Jamiya Nims, RN, BSN.  [phone] 

## 2014-12-13 NOTE — Progress Notes (Signed)
Patient has home CPAP and states that he can place himself on and off. RT made pt aware that if any assistance to call.

## 2014-12-14 MED FILL — Potassium Chloride Inj 2 mEq/ML: INTRAVENOUS | Qty: 40 | Status: AC

## 2014-12-14 MED FILL — Heparin Sodium (Porcine) Inj 1000 Unit/ML: INTRAMUSCULAR | Qty: 30 | Status: AC

## 2014-12-14 MED FILL — Magnesium Sulfate Inj 50%: INTRAMUSCULAR | Qty: 10 | Status: AC

## 2014-12-14 NOTE — Progress Notes (Signed)
12/14/2014 09:55 PM The patient ambulated 600 ft in the hallway without an assistive device.  The patient tolerated the activity well.  Will continue to monitor the patient. Lupita Dawn, RN

## 2014-12-14 NOTE — Discharge Summary (Signed)
Physician Discharge Summary  Patient ID: Victor Mcclure MRN: [id number] DOB/AGE: February 03, 1946 69 y.o.  Admit date: 12/10/2014 Discharge date: 12/14/2014  Admission Diagnoses: Severe CAD  Discharge Diagnoses:  Active Problems:   S/P CABG x 5  Patient Active Problem List   Diagnosis Date Noted  . S/P CABG x 5 12/10/2014  . Coronary artery disease due to lipid rich plaque 11/10/2014  . Hyperlipidemia 11/10/2014  . Abnormal nuclear stress test 11/04/2014   HPI:At time of consultation   69 year old Caucasian male nondiabetic nonsmoker presents with recently diagnosed severe three-vessel coronary disease. The patient's symptoms consisted of dyspnea with decreasing exercise tolerance. A stress test was positive and he subsequently underwent cardiac catheterization by Dr.Ganji . This demonstrated severe three-vessel coronary disease with 95% stenosis the RCA, 90% stenosis of the LAD And 75% stenosis of the circumflex marginal. LV function is preserved. Echocardiogram shows no significant valvular disease.  After the cardiac catheterization the patient was placed on your, beta blocker, and Crestor 10 mg daily. He takes aspirin 81 mg daily.  Patient has no history of diabetes. He denies history of hypertension. He is been a never smoker. His father died of coronary disease-MI.   He was admitted for elective CABG   Discharged Condition: good  Hospital Course: The patient was admitted electively for CABG. This  was performed on 12/10/2014 by Dr Prescott Gum . It was tolerated well and he was taken to the SICU in stable condition.  He did develop a large right sided pneumothorax that required placement of a tube. Repeat CXR showed complete resolution. He has remained hemodynamically stable in NSR. He has a mild stable post-op acute blood loss anemia. He has responded well to diuresis for mild volume overload. All tubes, lines and monitoring devices have been discontinued in the routine manner. Incisions  are healing well without signs of infection. Oxygen has been weaned and he is tolerating routine rehab modalities.   Consults: None  Significant Diagnostic Studies: routine post op lab/chest xrays  Treatments: surgery:  OPERATIVE REPORT   OPERATIONS: 1. Coronary artery bypass grafting x5 (left internal mammary artery to  left anterior descending artery, saphenous vein graft to diagonal,  saphenous vein graft to obtuse marginal 1, sequential saphenous  vein graft to posterior descending and posterolateral branch of the  right coronary artery). 2. Endoscopic harvest of right leg greater saphenous vein.  SURGEON: Ivin Poot, M.D.  ASSISTANT: Ellwood Handler, PA-C.  PREOPERATIVE DIAGNOSES: Severe three-vessel coronary artery disease, positive stress test, class III angina.  POSTOPERATIVE DIAGNOSES: Severe three-vessel coronary artery disease, positive stress test, class III angina.  ANESTHESIA: General by Dr. Oren Bracket.    CARDIOTHORACIC SURGERY OPERATIVE NOTE  Date of Procedure:12/11/2014  Preoperative Diagnosis:Right Pneumothorax  Postoperative Diagnosis:Same  Procedure:Right chest tube placement  Surgeon:Clarence H. Roxy Manns, MD  Anesthesia:1% lidocaine local with intravenous sedation  Disposition: 01-Home or Self Care    Medications at time of discharge:    Medication List    STOP taking these medications        isosorbide mononitrate 30 MG 24 hr tablet  Commonly known as:  IMDUR     metoprolol succinate 25 MG 24 hr tablet  Commonly known as:  TOPROL-XL      TAKE these medications        Acetylcarnitine HCl 500 MG Caps  Take 500 mg by mouth every evening.     amiodarone 200 MG tablet  Commonly known as:  PACERONE  Take 2 tablets (400  mg total) by mouth 2 (two) times daily. For 5 days, then decrease to 200  mg twice a day for 7 days, then decrease to 200 mg once a day     aspirin 325 MG EC tablet  Take 1 tablet (325 mg total) by mouth daily.     CHERRY CONCENTRATE PO  Take 1 tablet by mouth 2 (two) times daily.     Co Q 10 100 MG Caps  Take 100 mg by mouth daily.     FISH OIL PO  Take 1 tablet by mouth daily.     indomethacin 50 MG capsule  Commonly known as:  INDOCIN  Take 50 mg by mouth 3 (three) times daily as needed for mild pain or moderate pain.     losartan 25 MG tablet  Commonly known as:  COZAAR  Take 1 tablet (25 mg total) by mouth daily.     Magnesium 500 MG Caps  Take 500 mg by mouth daily.     metoprolol tartrate 25 MG tablet  Commonly known as:  LOPRESSOR  Take 1 tablet (25 mg total) by mouth 2 (two) times daily.     multivitamin tablet  Take 1 tablet by mouth daily.     NON FORMULARY  Take 1 oz by mouth 2 (two) times daily. Braggs whole Apple Cider Vinegar     NON FORMULARY  daily. CPAP     RED YEAST RICE PO  Take 1,200 mg by mouth daily.     rosuvastatin 10 MG tablet  Commonly known as:  CRESTOR  Take 10 mg by mouth every evening.     traMADol 50 MG tablet  Commonly known as:  ULTRAM  Take 1-2 tablets (50-100 mg total) by mouth every 4 (four) hours as needed for moderate pain.     TURMERIC PO  Take 750 mg by mouth daily.     vitamin C 1000 MG tablet  Take 1,000 mg by mouth daily.                Follow-up Information    Follow up with Len Childs, MD.   Specialty:  Cardiothoracic Surgery   Why:  4 week appointment with surgeon. Please obtain a chest x-ray at [facility] 1 hour prior to this appointment. [facility] is located in the same office complex.   Contact information:   [address] [place] [address] [phone]       Follow up with Adrian Prows, MD.   Specialty:  Cardiology   Why:  call to arrange for appt in 2 weeks   Contact information:   [place] [address] [phone]      The patient has been discharged on:   1.Beta Blocker:  Yes [ y  ]                              No   [   ]                              If No, reason:  2.Ace Inhibitor/ARB: Yes [  y ]                                     No  [    ]  If No, reason:  3.Statin:   Yes Blue.Reese   ]                  No  [   ]                  If No, reason:  4.Ecasa:  Yes  [ y  ]                  No   [   ]                  If No, reason:  Signed: GOLD,WAYNE E 12/14/2014, 8:53 AM  patient examined and medical record reviewed,agree with above note. Tharon Aquas Trigt III 12/15/2014

## 2014-12-14 NOTE — Progress Notes (Signed)
Pt has his home CPAP at bedside. He says he is able to put it on when he is ready. Pt understands to call RN/RT if assistance needed. RT will monitor.

## 2014-12-14 NOTE — Progress Notes (Signed)
12/14/2014 0905 EPW D/C'd per order and per protocol.  Ends intact. Pt. Tolerated well.  Advised bedrest x1hr.  Call bell in reach.  Vital signs collected per protocol.  Carney Corners

## 2014-12-14 NOTE — Progress Notes (Addendum)
      [address]       [place] [address]             [phone]      4 Days Post-Op Procedure(s) (LRB): CORONARY ARTERY BYPASS GRAFTING (CABG)x 5 -LIMA to LAD -SVG to DIAGONAL -SVG to OM -SEQUENTIAL SVG TO PDA, PLB (N/A) TRANSESOPHAGEAL ECHOCARDIOGRAM (TEE) (N/A)   Subjective:  Mr. Nanna has no complaints.  He states he is doing well and is anxious to get home.  Objective: Vital signs in last 24 hours: Temp:  [98.1 F (36.7 C)-98.9 F (37.2 C)] 98.1 F (36.7 C) (06/07 0438) Pulse Rate:  [58-70] 65 (06/07 0438) Cardiac Rhythm:  [-] Normal sinus rhythm (06/06 1930) Resp:  [18-20] 18 (06/07 0438) BP: (101-137)/(57-77) 129/77 mmHg (06/07 0438) SpO2:  [94 %-99 %] 98 % (06/07 0438) Weight:  [222 lb 11.2 oz (101.016 kg)] 222 lb 11.2 oz (101.016 kg) (06/07 0438)  Intake/Output from previous day: 06/06 0701 - 06/07 0700 In: 3 [I.V.:3] Out: 0233 [Urine:1800; Chest Tube:30]  General appearance: alert, cooperative and no distress Heart: regular rate and rhythm Lungs: clear to auscultation bilaterally Abdomen: soft, non-tender; bowel sounds normal; no masses,  no organomegaly Extremities: edema trace Wound: clean and dry  Lab Results:  Recent Labs  12/12/14 0415 12/13/14 0407  WBC 10.9* 9.6  HGB 11.4* 10.6*  HCT 34.2* 32.5*  PLT 108* 120*   BMET:  Recent Labs  12/12/14 0415 12/13/14 0407  NA 137 137  K 4.1 4.2  CL 102 103  CO2 28 25  GLUCOSE 118* 111*  BUN 20 20  CREATININE 1.29* 1.20  CALCIUM 8.3* 8.1*    PT/INR: No results for input(s): LABPROT, INR in the last 72 hours. ABG    Component Value Date/Time   PHART 7.319* 12/10/2014 2154   HCO3 21.5 12/10/2014 2154   TCO2 23 12/11/2014 1659   ACIDBASEDEF 5.0* 12/10/2014 2154   O2SAT 90.0 12/10/2014 2154   CBG (last 3)   Recent Labs  12/13/14 0619 12/13/14 1131 12/13/14 1633  GLUCAP 97 99 97    Assessment/Plan: S/P Procedure(s) (LRB): CORONARY ARTERY BYPASS GRAFTING  (CABG)x 5 -LIMA to LAD -SVG to DIAGONAL -SVG to OM -SEQUENTIAL SVG TO PDA, PLB (N/A) TRANSESOPHAGEAL ECHOCARDIOGRAM (TEE) (N/A)  1. CV- maintaining NSR, blood pressure improved- continue Lopressor, Amiodarone, Cozaar 2. Pulm- off oxygen, no acute issues, continue IS 3. Chest tube- minimal output since yesterday, will d/c chest tube 4. Renal- weight continues to trend down, will continue Lasix for now, but may be able to stop prior to discharge, repeat Bmet in AM 5. Dispo- patient doing well, maintaining NSR, will d/c EPW and chest tube today, home in AM if remains stable   LOS: 4 days    BARRETT, ERIN 12/14/2014  DC instructions reviewed with patient patient examined and medical record reviewed,agree with above note. Tharon Aquas Trigt III 12/14/2014

## 2014-12-14 NOTE — Progress Notes (Signed)
12/14/2014 1130 Pt ambulated 966ft without asistive device and on RA.  Pt tolerated well.  Pt back in room and up in chair.  Call bell placed within reach. Carney Corners

## 2014-12-14 NOTE — Progress Notes (Signed)
1358 Observed pt up in hall earlier with steady gait with RN. Will follow up if time permits for ambulation. Graylon Good RN BSN 12/14/2014 1:59 PM

## 2014-12-14 NOTE — Progress Notes (Signed)
12/14/2014  0900 Chest tube D/C'd per Md orders and protocol.  Pt. Tolerated well. Carney Corners

## 2014-12-15 ENCOUNTER — Inpatient Hospital Stay (HOSPITAL_COMMUNITY): Payer: PPO

## 2014-12-15 MED ORDER — METOPROLOL TARTRATE 25 MG PO TABS: 25.0000 mg | ORAL_TABLET | Freq: Two times a day (BID) | ORAL | Status: DC

## 2014-12-15 MED ORDER — AMIODARONE HCL 200 MG PO TABS: 400.0000 mg | ORAL_TABLET | Freq: Two times a day (BID) | ORAL | Status: DC

## 2014-12-15 MED ORDER — ASPIRIN 325 MG PO TBEC: 325.0000 mg | DELAYED_RELEASE_TABLET | Freq: Every day | ORAL | Status: DC

## 2014-12-15 MED ORDER — TRAMADOL HCL 50 MG PO TABS: 50.0000 mg | ORAL_TABLET | ORAL | Status: DC | PRN

## 2014-12-15 MED ORDER — LOSARTAN POTASSIUM 25 MG PO TABS: 25.0000 mg | ORAL_TABLET | Freq: Every day | ORAL | Status: DC

## 2014-12-15 NOTE — Progress Notes (Signed)
12/15/2014 10:56 AM Discharge AVS meds taken today and those due this evening reviewed.  Follow-up appointments and when to call md reviewed.  D/C IV and TELE.  Questions and concerns addressed.   D/C home per orders. Carney Corners

## 2014-12-15 NOTE — Progress Notes (Addendum)
      [address]       [place] [address]             [phone]      5 Days Post-Op Procedure(s) (LRB): CORONARY ARTERY BYPASS GRAFTING (CABG)x 5 -LIMA to LAD -SVG to DIAGONAL -SVG to OM -SEQUENTIAL SVG TO PDA, PLB (N/A) TRANSESOPHAGEAL ECHOCARDIOGRAM (TEE) (N/A)   Subjective:  Victor Mcclure has no complaints this morning.  He is ready to go home today.  Objective: Vital signs in last 24 hours: Temp:  [98.1 F (36.7 C)-99 F (37.2 C)] 98.1 F (36.7 C) (06/08 0534) Pulse Rate:  [62-71] 62 (06/08 0534) Cardiac Rhythm:  [-] Normal sinus rhythm (06/08 0742) Resp:  [18] 18 (06/08 0534) BP: (111-133)/(66-88) 117/73 mmHg (06/08 0534) SpO2:  [93 %-97 %] 95 % (06/08 0534) Weight:  [215 lb 6.2 oz (97.7 kg)] 215 lb 6.2 oz (97.7 kg) (06/08 0534)  Intake/Output from previous day: 06/07 0701 - 06/08 0700 In: 3 [I.V.:3] Out: -   General appearance: alert, cooperative and no distress Heart: regular rate and rhythm Lungs: clear to auscultation bilaterally Abdomen: soft, non-tender; bowel sounds normal; no masses,  no organomegaly Extremities: edema none Wound: clean and dry  Lab Results:  Recent Labs  12/13/14 0407  WBC 9.6  HGB 10.6*  HCT 32.5*  PLT 120*   BMET:  Recent Labs  12/13/14 0407  NA 137  K 4.2  CL 103  CO2 25  GLUCOSE 111*  BUN 20  CREATININE 1.20  CALCIUM 8.1*    PT/INR: No results for input(s): LABPROT, INR in the last 72 hours. ABG    Component Value Date/Time   PHART 7.319* 12/10/2014 2154   HCO3 21.5 12/10/2014 2154   TCO2 23 12/11/2014 1659   ACIDBASEDEF 5.0* 12/10/2014 2154   O2SAT 90.0 12/10/2014 2154   CBG (last 3)   Recent Labs  12/13/14 0619 12/13/14 1131 12/13/14 1633  GLUCAP 97 99 97    Assessment/Plan: S/P Procedure(s) (LRB): CORONARY ARTERY BYPASS GRAFTING (CABG)x 5 -LIMA to LAD -SVG to DIAGONAL -SVG to OM -SEQUENTIAL SVG TO PDA, PLB (N/A) TRANSESOPHAGEAL ECHOCARDIOGRAM (TEE) (N/A)  1. CV-  maintaining NSR- continue Amiodarone, Lopressor, Cozaar 2. Pulm- no acute issues, continue IS 3. Renal- weight is stable, no LE edema will d/c Lasix 4. Dispo- patient doing well, CXR stable will d/c home today   LOS: 5 days    BARRETT, ERIN 12/15/2014  patient examined and medical record reviewed,agree with above note. Tharon Aquas Trigt III 12/15/2014

## 2014-12-15 NOTE — Progress Notes (Signed)
[id number] Education completed with pt and girlfriend who voiced understanding. Discussed CRP 2 and pt would like referral to [facility] program. Put on discharge video for viewing. Graylon Good RN BSN 12/15/2014 10:26 AM

## 2014-12-15 NOTE — Discharge Instructions (Signed)
1. Patient may shower and perform daily activities (getting dressed, preparing light meals) 2. Ambulate 3 times per day, can be done outside, patient is not home bound 3. May drive in 2-3 weeks short distances    Endoscopic Saphenous Vein Harvesting Care After Refer to this sheet in the next few weeks. These instructions provide you with information on caring for yourself after your procedure. Your health care provider may also give you more specific instructions. Your treatment has been planned according to current medical practices, but problems sometimes occur. Call your health care provider if you have any problems or questions after your procedure. HOME CARE INSTRUCTIONS Medicine  Take whatever pain medicine your surgeon prescribes. Follow the directions carefully. Do not take over-the-counter pain medicine unless your surgeon says it is okay. Some pain medicine can cause bleeding problems for several weeks after surgery.  Follow your surgeon's instructions about driving. You will probably not be permitted to drive after heart surgery.  Take any medicines your surgeon prescribes. Any medicines you took before your heart surgery should be checked with your health care provider before you start taking them again. Wound care  If your surgeon has prescribed an elastic bandage or stocking, ask how long you should wear it.  Check the area around your surgical cuts (incisions) whenever your bandages (dressings) are changed. Look for any redness or swelling.  You will need to return to have the stitches (sutures) or staples taken out. Ask your surgeon when to do that.  Ask your surgeon when you can shower or bathe. Activity  Try to keep your legs raised when you are sitting.  Do any exercises your health care providers have given you. These may include deep breathing exercises, coughing, walking, or other exercises. SEEK MEDICAL CARE IF:  You have any questions about your  medicines.  You have more leg pain, especially if your pain medicine stops working.  New or growing bruises develop on your leg.  Your leg swells, feels tight, or becomes red.  You have numbness in your leg. SEEK IMMEDIATE MEDICAL CARE IF:  Your pain gets much worse.  Blood or fluid leaks from any of the incisions.  Your incisions become warm, swollen, or red.  You have chest pain.  You have trouble breathing.  You have a fever.  You have more pain near your leg incision. MAKE SURE YOU:  Understand these instructions.  Will watch your condition.  Will get help right away if you are not doing well or get worse. Document Released: 03/07/2011 Document Revised: 06/30/2013 Document Reviewed: 03/07/2011 [facility] Patient Information 2015 [place], [place]. This information is not intended to replace advice given to you by your health care provider. Make sure you discuss any questions you have with your health care provider. Coronary Artery Bypass Grafting, Care After These instructions give you information on caring for yourself after your procedure. Your doctor may also give you more specific instructions. Call your doctor if you have any problems or questions after your procedure.  HOME CARE  Only take medicine as told by your doctor. Take medicines exactly as told. Do not stop taking medicines or start any new medicines without talking to your doctor first.  Take your pulse as told by your doctor.  Do deep breathing as told by your doctor. Use your breathing device (incentive spirometer), if given, to practice deep breathing several times a day. Support your chest with a pillow or your arms when you take deep breaths or cough.  Keep the area clean, dry, and protected where the surgery cuts (incisions) were made. Remove bandages (dressings) only as told by your doctor. If strips were applied to surgical area, do not take them off. They fall off on their own.  Check the surgery  area daily for puffiness (swelling), redness, or leaking fluid.  If surgery cuts were made in your legs:  Avoid crossing your legs.  Avoid sitting for long periods of time. Change positions every 30 minutes.  Raise your legs when you are sitting. Place them on pillows.  Wear stockings that help keep blood clots from forming in your legs (compression stockings).  Only take sponge baths until your doctor says it is okay to take showers. Pat the surgery area dry. Do not rub the surgery area with a washcloth or towel. Do not bathe, swim, or use a hot tub until your doctor says it is okay.  Eat foods that are high in fiber. These include raw fruits and vegetables, whole grains, beans, and nuts. Choose lean meats. Avoid canned, processed, and fried foods.  Drink enough fluids to keep your pee (urine) clear or pale yellow.  Weigh yourself every day.  Rest and limit activity as told by your doctor. You may be told to:  Stop any activity if you have chest pain, shortness of breath, changes in heartbeat, or dizziness. Get help right away if this happens.  Move around often for short amounts of time or take short walks as told by your doctor. Gradually become more active. You may need help to strengthen your muscles and build endurance.  Avoid lifting, pushing, or pulling anything heavier than 10 pounds (4.5 kg) for at least 6 weeks after surgery.  Do not drive until your doctor says it is okay.  Ask your doctor when you can go back to work.  Ask your doctor when you can begin sexual activity again.  Follow up with your doctor as told. GET HELP IF:  You have puffiness, redness, more pain, or fluid draining from the incision site.  You have a fever.  You have puffiness in your ankles or legs.  You have pain in your legs.  You gain 2 or more pounds (0.9 kg) a day.  You feel sick to your stomach (nauseous) or throw up (vomit).  You have watery poop (diarrhea). GET HELP RIGHT AWAY  IF:  You have chest pain that goes to your jaw or arms.  You have shortness of breath.  You have a fast or irregular heartbeat.  You notice a "clicking" in your breastbone when you move.  You have numbness or weakness in your arms or legs.  You feel dizzy or light-headed. MAKE SURE YOU:  Understand these instructions.  Will watch your condition.  Will get help right away if you are not doing well or get worse. Document Released: 06/30/2013 Document Reviewed: 06/30/2013 [facility] Patient Information 2015 [place], [place]. This information is not intended to replace advice given to you by your health care provider. Make sure you discuss any questions you have with your health care provider.

## 2014-12-15 NOTE — Progress Notes (Signed)
Discharge Med Rec Note: Victor Mcclure, Victor Mcclure  The following medications were initiated, discontinued, or adjusted during Victor Mcclure's stay for coronary artery bypass grafting (CABG x 5) on 12/10/14.  Initiated: Amiodarone 400 mg PO bid x 5 days, then 200 mg BID x 7 days, then 200 mg daily. Victor Mcclure developed post-operative atrial fibrillation on 6/5 and was initiated on amiodarone therapy. Since initiation of therapy he has maintained sinus rhythm.   Losartan 25 mg PO daily. This medication was initiated during Victor Mcclure's stay for improvement in blood pressure control as blood pressures were elevated postoperatively.   Tramadol 50-100 mg PO prn moderate pain. Victor Mcclure was discharged with a prescription for this medication for as needed control of moderate postoperative pain.  Adjusted: Aspirin 81 mg PO daily increased to 325 mg PO daily. After his CABG on 6/3, Victor Mcclure's dose of aspirin was increased for improvement in long-term graft patency.   Metoprolol XL 25 mg PO daily changed to metoprolol tartrate 25 mg PO BID. This medication was adjusted to the shorter acting formulation for ease of titration and for the continued benefit of a reduction in the risk of postoperative atrial fibrillation, myocardial ischemia and mortality.   Stopped: Indomethacin PRN.  Victor Mcclure reported taking indomethacin for pain prior to admission. In the setting of concomitant aspirin use increasing the risk for bleeding and potential cardiovascular risk, he was advised NOT to take this medication any further. He was informed to take Tylenol for aches and pains after his peri-op opiate Rx runs out.  Turmeric 750 mg PO daily. Since Victor Mcclure will be sent home on a higher dose of aspirin, suggested discontinuation of this herbal supplement as it may have additive antiplatelet activity and increase his risk for bleeding.   Meds were reconciled in Epic and patient was counseled on date of  discharge.  Ruta Hinds. Velva Harman, PharmD, [place] Clinical Pharmacist - Resident Pager: [phone] Pharmacy: [phone] 12/15/2014 9:18 AM

## 2014-12-15 NOTE — Progress Notes (Signed)
12/15/2014 8:21 AM Chest tube sutures removed.  Dr. Prescott Gum in room, wants me to leave in the 2 CT sutures left from the CT removal yesterday.  Will remove them in the office.  Pt tolerated well. Carney Corners

## 2015-01-06 ENCOUNTER — Other Ambulatory Visit: Payer: Self-pay | Admitting: Cardiothoracic Surgery

## 2015-01-06 DIAGNOSIS — Z951 Presence of aortocoronary bypass graft: Secondary | ICD-10-CM

## 2015-01-12 ENCOUNTER — Encounter: Payer: Self-pay | Admitting: Cardiothoracic Surgery

## 2015-01-12 ENCOUNTER — Ambulatory Visit (INDEPENDENT_AMBULATORY_CARE_PROVIDER_SITE_OTHER): Payer: Self-pay | Admitting: Cardiothoracic Surgery

## 2015-01-12 ENCOUNTER — Ambulatory Visit
Admission: RE | Admit: 2015-01-12 | Discharge: 2015-01-12 | Disposition: A | Discharge status: Home / Self Care | Payer: PPO | Source: Ambulatory Visit | Attending: Cardiothoracic Surgery | Admitting: Cardiothoracic Surgery

## 2015-01-12 VITALS — BP 131/84 | HR 55 | Resp 16 | Ht 70.5 in | Wt 216.0 lb

## 2015-01-12 DIAGNOSIS — I2510 Atherosclerotic heart disease of native coronary artery without angina pectoris: Secondary | ICD-10-CM

## 2015-01-12 DIAGNOSIS — Z951 Presence of aortocoronary bypass graft: Secondary | ICD-10-CM

## 2015-01-12 NOTE — Progress Notes (Signed)
PCP is Woody Seller, MD Referring Provider is Adrian Prows, MD  Chief Complaint  Patient presents with  . Routine Post Op    s/p CABG 12/10/14 with a cxr...HAS SEEN DR. Einar Gip AND WILL START CARD REHAB    HPI:one month after multivessel CABG for progressive angina. The patient is doing well, gaining strength. Surgical incisions are well-healed. He is taking amiodarone for some transient postop atrial fibrillation. He is on Plavix per Dr. Einar Gip. He is going to start phase II cardiac rehabilitation in the very near future.   Past Medical History  Diagnosis Date  . Fatigue   . Chest pressure   . Sinusitis   . Chronic tophaceous gout   . Donor of kidney for transplant 2006  . Hyperlipemia   . Diaphragmatic hernia   . Hypertension   . Acute renal insufficiency   . Lower back pain   . Obesity   . Myalgia and myositis   . Pain in joint, pelvic region and thigh   . Shoulder pain   . Coronary artery disease   . Complication of anesthesia     "kind of wild walking up; don't come out of it very well"   . Family history of adverse reaction to anesthesia     "son is kind of wild walking up; don't come out of it very well"  . OSA on CPAP   . Solitary kidney   . GERD (gastroesophageal reflux disease)   . History of hiatal hernia     "maybe" (12/13/2014)    Past Surgical History  Procedure Laterality Date  . Left heart catheterization with coronary angiogram N/A 11/04/2014    Procedure: LEFT HEART CATHETERIZATION WITH CORONARY ANGIOGRAM;  Surgeon: Adrian Prows, MD;  Location: [facility] CATH LAB;  Service: Cardiovascular;  Laterality: N/A;  . Kidney donation  2006  . Repair peroneal tendons ankle Right   . Tee without cardioversion N/A 12/10/2014    Procedure: TRANSESOPHAGEAL ECHOCARDIOGRAM (TEE);  Surgeon: Ivin Poot, MD;  Location: [facility];  Service: Open Heart Surgery;  Laterality: N/A;  . Tonsillectomy and adenoidectomy    . Coronary artery bypass graft N/A 12/10/2014    Procedure: CORONARY  ARTERY BYPASS GRAFTING (CABG)x 5 -LIMA to LAD -SVG to DIAGONAL -SVG to OM -SEQUENTIAL SVG TO PDA, PLB;  Surgeon: Ivin Poot, MD;  Location: [facility];  Service: Open Heart Surgery;  Laterality: N/A;  . Cardiac catheterization  10/2014  . Coronary angioplasty      Family History  Problem Relation Age of Onset  . Heart disease Mother   . Heart attack Father   . Stroke Neg Hx     Social History History  Substance Use Topics  . Smoking status: Never Smoker   . Smokeless tobacco: Never Used  . Alcohol Use: 1.2 oz/week    0 Standard drinks or equivalent, 2 Glasses of wine per week    Current Outpatient Prescriptions  Medication Sig Dispense Refill  . amiodarone (PACERONE) 200 MG tablet Take 2 tablets (400 mg total) by mouth 2 (two) times daily. For 5 days, then decrease to 200 mg BID for 7 days, then decrease to 200 mg QD 90 tablet 1  . aspirin EC 81 MG tablet Take 81 mg by mouth daily.    . clopidogrel (PLAVIX) 75 MG tablet Take 75 mg by mouth daily.    . Coenzyme Q10 (CO Q 10) 100 MG CAPS Take 100 mg by mouth daily.    . fexofenadine (ALLEGRA)  180 MG tablet Take 180 mg by mouth daily.    Marland Kitchen losartan (COZAAR) 25 MG tablet Take 1 tablet (25 mg total) by mouth daily. 30 tablet 3  . metoprolol tartrate (LOPRESSOR) 25 MG tablet Take 1 tablet (25 mg total) by mouth 2 (two) times daily. 60 tablet 3  . Multiple Vitamin (MULTIVITAMIN) tablet Take 1 tablet by mouth daily.    . Omega-3 Fatty Acids (FISH OIL PO) Take 1 tablet by mouth daily.    . rosuvastatin (CRESTOR) 10 MG tablet Take 10 mg by mouth every evening.    . Ascorbic Acid (VITAMIN C) 1000 MG tablet Take 1,000 mg by mouth daily.    Marland Kitchen CHERRY CONCENTRATE PO Take 1 tablet by mouth 2 (two) times daily.    . indomethacin (INDOCIN) 50 MG capsule Take 50 mg by mouth 3 (three) times daily as needed for mild pain or moderate pain.    . Magnesium 500 MG CAPS Take 500 mg by mouth daily.    . NON FORMULARY Take 1 oz by mouth 2 (two) times daily.  [place] whole Apple Cider Vinegar    . NON FORMULARY daily. CPAP    . Red Yeast Rice Extract (RED YEAST RICE PO) Take 1,200 mg by mouth daily.    . traMADol (ULTRAM) 50 MG tablet Take 1-2 tablets (50-100 mg total) by mouth every 4 (four) hours as needed for moderate pain. (Patient not taking: Reported on 01/12/2015) 30 tablet 0  . TURMERIC PO Take 750 mg by mouth daily.     No current facility-administered medications for this visit.    Allergies  Allergen Reactions  . Statins     Joint ache. Tolerating Crestor.     Review of Systems  Improving strength appetite No ankle edema No fever Some soreness of the left arm with movement, some tingling of the left fifth finger  BP 131/84 mmHg  Pulse 55  Resp 16  Ht 5' 10.5" (1.791 m)  Wt 216 lb (97.977 kg)  BMI 30.54 kg/m2  SpO2 98% Physical Exam Alert and comfortable Lungs clear Sternum well-healed and stable Heart rhythm regular without murmur or gallop Leg incision is well-healed No pedal edema  Diagnostic Tests: Chest x-ray clear, sternal wires intact. No pleural effusion.  Impression: Doing well now one month postop CABG. He will continue the amiodarone until the prescription runs out and then stop. He will refrain from lifting more than 20 pounds until September 1. We discussed when he can start resuming yard work.  Plan:return for 3 months review of progress.   Len Childs, MD [facility] [phone]

## 2015-01-13 ENCOUNTER — Encounter (HOSPITAL_COMMUNITY)
Admission: RE | Admit: 2015-01-13 | Discharge: 2015-01-13 | Disposition: A | Discharge status: Home / Self Care | Payer: PPO | Source: Ambulatory Visit | Attending: Cardiology | Admitting: Cardiology

## 2015-01-13 DIAGNOSIS — Z951 Presence of aortocoronary bypass graft: Secondary | ICD-10-CM | POA: Insufficient documentation

## 2015-01-13 DIAGNOSIS — Z48812 Encounter for surgical aftercare following surgery on the circulatory system: Secondary | ICD-10-CM | POA: Insufficient documentation

## 2015-01-13 NOTE — Progress Notes (Signed)
Cardiac Rehab Medication Review by a Pharmacist  Does the patient  feel that his/her medications are working for him/her?  yes  Has the patient been experiencing any side effects to the medications prescribed?  no  Does the patient measure his/her own blood pressure or blood glucose at home?  yes   Does the patient have any problems obtaining medications due to transportation or finances?   no  Understanding of regimen: good Understanding of indications: good Potential of compliance: poor    Pharmacist comments: 10 YOM presents to cardiac rehab stating his desire to stop some prescribed medications on his medication regimen. Pt plans to switch to his own regimen consisting of herbals/supplements. Pt is skeptical of statin therapies including possible side effects dementia, and arthralgia. Would recommend more discussion with pt about his medication regimen.     Kem Parkinson, PharmD Pharmacy Resident 01/13/2015 8:39 AM

## 2015-01-17 ENCOUNTER — Encounter (HOSPITAL_COMMUNITY): Payer: Self-pay

## 2015-01-17 ENCOUNTER — Encounter (HOSPITAL_COMMUNITY)
Admission: RE | Admit: 2015-01-17 | Discharge: 2015-01-17 | Disposition: A | Discharge status: Home / Self Care | Payer: PPO | Source: Ambulatory Visit | Attending: Cardiology | Admitting: Cardiology

## 2015-01-17 DIAGNOSIS — Z951 Presence of aortocoronary bypass graft: Secondary | ICD-10-CM | POA: Diagnosis not present

## 2015-01-17 DIAGNOSIS — Z48812 Encounter for surgical aftercare following surgery on the circulatory system: Secondary | ICD-10-CM | POA: Diagnosis present

## 2015-01-17 NOTE — Progress Notes (Signed)
Pt started cardiac rehab today.  Pt tolerated light exercise without difficulty. VSS, telemetry-sinus rhythm, rare PAC, asymptomatic.  Medication list reconciled.  Pt verbalized compliance with medications and denies barriers to compliance. PSYCHOSOCIAL ASSESSMENT:  PHQ-0. Pt exhibits positive coping skills, hopeful outlook with supportive family. No psychosocial needs identified at this time, no psychosocial interventions necessary.    Pt enjoys working outdoors, yard work and clearing land. Pt is currently unable to participate in these activities and this is upsetting to him.     Pt cardiac rehab  goal is  to increase energy, strength and stamina.  Pt encouraged to participate in exercising on your own education class with maintenance of individualized home exercise plan to increase ability to achieve these goals.    Pt oriented to exercise equipment and routine.  Understanding verbalized.

## 2015-01-19 ENCOUNTER — Encounter (HOSPITAL_COMMUNITY)
Admission: RE | Admit: 2015-01-19 | Discharge: 2015-01-19 | Disposition: A | Discharge status: Home / Self Care | Payer: PPO | Source: Ambulatory Visit | Attending: Cardiology | Admitting: Cardiology

## 2015-01-19 DIAGNOSIS — Z48812 Encounter for surgical aftercare following surgery on the circulatory system: Secondary | ICD-10-CM | POA: Diagnosis not present

## 2015-01-21 ENCOUNTER — Encounter (HOSPITAL_COMMUNITY): Payer: PPO

## 2015-01-24 ENCOUNTER — Encounter (HOSPITAL_COMMUNITY): Payer: PPO

## 2015-01-26 ENCOUNTER — Encounter (HOSPITAL_COMMUNITY): Payer: PPO

## 2015-01-28 ENCOUNTER — Telehealth (HOSPITAL_COMMUNITY): Payer: Self-pay | Admitting: *Deleted

## 2015-01-28 ENCOUNTER — Encounter (HOSPITAL_COMMUNITY): Admission: RE | Admit: 2015-01-28 | Payer: PPO | Source: Ambulatory Visit

## 2015-01-28 NOTE — Telephone Encounter (Signed)
Pt absent from cardiac rehab this week.  Called to inquire of well being.  Pt stated that he did not need to return.  He is already walking 4 miles a day.  He did not like the meditation and felt it is best for him to exercise on his own for now.  Cherre Huger, BSN

## 2015-01-31 ENCOUNTER — Encounter (HOSPITAL_COMMUNITY): Payer: PPO

## 2015-02-02 ENCOUNTER — Encounter (HOSPITAL_COMMUNITY): Payer: PPO

## 2015-02-04 ENCOUNTER — Encounter (HOSPITAL_COMMUNITY): Payer: PPO

## 2015-02-07 ENCOUNTER — Encounter (HOSPITAL_COMMUNITY): Payer: PPO

## 2015-02-09 ENCOUNTER — Encounter (HOSPITAL_COMMUNITY): Payer: PPO

## 2015-02-11 ENCOUNTER — Encounter (HOSPITAL_COMMUNITY): Payer: PPO

## 2015-02-14 ENCOUNTER — Encounter (HOSPITAL_COMMUNITY): Payer: PPO

## 2015-02-16 ENCOUNTER — Encounter (HOSPITAL_COMMUNITY): Payer: PPO

## 2015-02-18 ENCOUNTER — Encounter (HOSPITAL_COMMUNITY): Payer: PPO

## 2015-02-21 ENCOUNTER — Encounter (HOSPITAL_COMMUNITY): Payer: PPO

## 2015-02-23 ENCOUNTER — Encounter (HOSPITAL_COMMUNITY): Payer: PPO

## 2015-02-25 ENCOUNTER — Encounter (HOSPITAL_COMMUNITY): Payer: PPO

## 2015-02-28 ENCOUNTER — Encounter (HOSPITAL_COMMUNITY): Payer: PPO

## 2015-03-02 ENCOUNTER — Encounter (HOSPITAL_COMMUNITY): Payer: PPO

## 2015-03-04 ENCOUNTER — Encounter (HOSPITAL_COMMUNITY): Payer: PPO

## 2015-03-07 ENCOUNTER — Encounter (HOSPITAL_COMMUNITY): Payer: PPO

## 2015-03-09 ENCOUNTER — Encounter (HOSPITAL_COMMUNITY): Payer: PPO

## 2015-03-11 ENCOUNTER — Encounter (HOSPITAL_COMMUNITY): Payer: PPO

## 2015-03-16 ENCOUNTER — Encounter (HOSPITAL_COMMUNITY): Payer: PPO

## 2015-03-18 ENCOUNTER — Encounter (HOSPITAL_COMMUNITY): Payer: PPO

## 2015-03-21 ENCOUNTER — Encounter (HOSPITAL_COMMUNITY): Payer: PPO

## 2015-03-23 ENCOUNTER — Encounter (HOSPITAL_COMMUNITY): Payer: PPO

## 2015-03-25 ENCOUNTER — Encounter (HOSPITAL_COMMUNITY): Payer: PPO

## 2015-03-28 ENCOUNTER — Encounter (HOSPITAL_COMMUNITY): Payer: PPO

## 2015-03-30 ENCOUNTER — Encounter: Payer: Self-pay | Admitting: Podiatry

## 2015-03-30 ENCOUNTER — Ambulatory Visit (INDEPENDENT_AMBULATORY_CARE_PROVIDER_SITE_OTHER): Payer: PPO | Admitting: Podiatry

## 2015-03-30 ENCOUNTER — Encounter (HOSPITAL_COMMUNITY): Payer: PPO

## 2015-03-30 VITALS — BP 162/98 | HR 69 | Resp 12

## 2015-03-30 DIAGNOSIS — D492 Neoplasm of unspecified behavior of bone, soft tissue, and skin: Secondary | ICD-10-CM

## 2015-03-30 NOTE — Progress Notes (Signed)
   Subjective:    Patient ID: Victor Mcclure, male    DOB: [birth year], 69 y.o.   MRN: [id number]  HPI  This patient presents today complaining of a gradually increasing soft tissue mass on the lateral right ankle over the past 5 years. He has difficulty wearing a shoe that presses against the area resulting in local soreness with direct shoe pressure. He describes having an ankle fracture repair and the treating surgeon told patient at that time  that he had remove gouty debris in that area at that time approximate 5 years ago. This lesion is gradually increased in size since then. Patient would like to have the area remove possible.   Patient has undergone vascular bypass surgery this year and is currently taking Plavix as well as aspirin.   Review of Systems  All other systems reviewed and are negative.      Objective:   Physical Exam  Orientated 3  Vascular: No peripheral edema noted bilaterally DP and PT pulses 2/4 bilaterally Capillary reflex immediate bilaterally  Neurological: Sensation to 10 g monofilament wire intact 5/5 bilaterally Vibratory sensation reactive bilaterally Ankle reflex equal and reactive bilaterally  Dermatological: Well-healed surgical scar lateral lateral right ankle Firm palpable soft tissue mass lateral right ankle measuring 4.5 x 5.0 x 1.50 cm. The lesions appears encapsulated and not attach the overlying skin.  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  I am referring patient to Dr. Earleen Newport for further evaluation and treatment as indicated. As patient has had recent CABG surgery and on Plavix consideration for discontinuing Plavix for elective surgery would have to be discussed with cardiologist.  Refer  patient to Dr. Earleen Newport for further evaluation and treatment

## 2015-03-30 NOTE — Patient Instructions (Signed)
Today your physical examination suggested a soft tissue mass most likely consistent with a possible tophaceous or gouty mass. I am referring you to Dr. Earleen Newport in our practice for further evaluation and follow-up for this

## 2015-04-01 ENCOUNTER — Encounter (HOSPITAL_COMMUNITY): Payer: PPO

## 2015-04-03 DIAGNOSIS — I2510 Atherosclerotic heart disease of native coronary artery without angina pectoris: Secondary | ICD-10-CM

## 2015-04-03 NOTE — H&P (Signed)
OFFICE VISIT NOTES COPIED TO EPIC FOR DOCUMENTATION  Victor Mcclure 03/30/2015 1:14 PM Location: [facility] Patient #: [id number] DOB: [birth year] Widowed / Language: Victor Mcclure / Race: White Male   History of Present Illness Victor Page MD; 03/30/2015 3:35 PM) Patient words: f/u for irregular heart rate, chest discomfort; Pt states within the last week, he notices while walking at usual pace, he feels pressure in chest and left shoulder, and heart rate excel. Pt wants to discuss the possibility of having another minor surgery to remove Gout Nodule from his Ankle. Pt wants to know from Cardiac standpoint if it would be safe for him to have surgery due to taking Plavix.  The patient is a 69 year old male who presents for a Follow-up for Coronary artery disease. 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 Patient underwent coronary artery bypass grafting due to multivessel disease on 12/10/2014. He had been doing well until one week ago he started noticing left-sided chest discomfort with exertional activities associated with marked applications.  He initially decreased his activity but as soon as he resumed his activity he started getting chest discomfort again. He fact that he has had recurrence of CAD and wanted to be seen and hence called our office to make an appointment. He has not had any rest pain, no shortness of breath, only symptom associated with left-sided chest pain is palpitation.   He does have sublingual nitroglycerin but states that he has not had to use this as the pain does subside within a couple minutes of resting. No dizziness or syncope. No PND or orthopnea. He had postoperative atrial fibrillation which resolved on amiodarone that he took for a  short time post op CABG.   Problem List/Past Medical Victor Mcclure; 03/30/2015 1:17 PM) Atypical chest pain (R07.89) Coronary angiogram 11/04/2014: LVEF 55%. Severe diffuse coronary calcification. RCA tandem high-grade 90% stenosis proximal to mid and distal 80%. LAD tandem 80-90% stenosis tandem. Amenable for PTCA. Circumflex diffuse 40-50% with stenosis. Hyperlipidemia, group B (E78.1) Labs 01/19/2015: Total cholesterol 134, triglycerides 139, HDL 45, LDL 61, LDL particle # 830, LP-IR score 67 Obesity (BMI 30.0-34.9) (E66.9) Excessive postexertional fatigue (R53.83) Abnormal stress test (R94.39) Exercise sestamibi stress test 10/29/2014: 1. The patient exercised according to Bruce Protocol, Total time recorded 07.30 min achieving max heart rate of 133 which was 86 % of MPHR for age and 9.33METS of work. Normal BP response. There was 2 mm upsloping ST depression with T changes at peak exercise which persisted for > 52mnutes into recovery suggestive of ischemia with exercise stress test. Stress terminated due to THR (>85% MPHR)/MPHR met and fatigue. 2. There is a moderate area of very mild ischemia in the basal inferior, mid inferior and apical inferior myocardial wall(s). The left ventricular ejection fraction was calculated or visually estimated to be 63%. BMI 31.0-31.9,adult (Z68.31) Benign essential hypertension (I10) Echo- 09/29/2014 1. Left ventricle cavity is normal in size. Mild to moderate concentric hypertrophy of the left ventricle. Normal global wall motion. Calculated EF 62%. 2. Trace mitral regurgitation. Mild tricuspid regurgitation. No evidence of pulmonary hypertension. Angina pectoris (I20.9) Hypercholesteremia (E78.0) Lipid profile 09/29/2014: Total cholesterol 200, triglycerides 193, HDL 38, LDL 123. Serum glucose 100 mg, BUN 18, serum creatinine 1.44. EGFR 50 mL. Potassium 4.7. Chronic kidney disease, stage 3 (N18.3) Labs  09/29/2014: Serum glucose 100 mg, BUN 18,  serum creatinine 1.44. EGFR 50 mL. Potassium 4.7. History of coronary artery bypass graft (Z95.1) CABG on 12/10/2014: 5 with LIMA to LAD, SVG to D1, this feet to OM1 and sequential SVG to PDA and PL of RCA- Dr. Dahlia Byes. 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 Dahlia Byes. Coronary angiogram 11/04/2014: LVEF 55%. Severe diffuse coronary calcification. RCA tandem high-grade 90% stenosis proximal to mid and distal 80%. Collaterals to the RCA from left. LAD tandem 80-90% stenosis tandem. Circumflex diffuse 40-50% with stenosis. Exercise sestamibi stress test 10/29/2014: 1. The patient exercised according to Bruce Protocol, Total time recorded 07.30 min achieving max heart rate of 133 which was 86 % of MPHR for age and 9.33METS of work. Normal BP response. There was 2 mm upsloping ST depression with T changes at peak exercise which persisted for > 37mnutes into recovery suggestive of ischemia with exercise stress test. Stress terminated due to THR (>85% MPHR)/MPHR met and fatigue. 2. There is a moderate area of very mild ischemia in the basal inferior, mid inferior and apical inferior myocardial wall(s). The left ventricular ejection fraction was calculated or visually estimated to be 63%. History of atrial fibrillation (Z86.79)12/10/2014 s/p CABG on 12/10/2014, no recurrence  Allergies (Victor BrookBeane; 92016-10-111:17 PM) Lipitor *ANTIHYPERLIPIDEMICS* Muscle pains. Simvastatin *ANTIHYPERLIPIDEMICS* Muscle pains. Pravastatin Sodium *ANTIHYPERLIPIDEMICS*  Muscle pains.  Family History (Victor BrookBeane; 92016/10/111:17 PM) Mother Deceased. at age 73042 from complications of Alzheimer's. No known Heart issues while alive. Father Deceased. at age 67 from CSpencer Hx of MI in his early 667's Brother 1 older, Known DM  Social History (Victor BrookBeane; 910/11/20161:17 PM) Current tobacco use Never smoker. Alcohol Use Occasional alcohol use. Marital status Widowed. Number of Children 6. 2 Deceased Living Situation Lives alone.  Past Surgical History (Victor BrookBeane; 910-11-20161:17 PM) Tonsillectomy at age [age]Repair to left index finger due to laceration1998 Repair to Torn Ligament to right ankle2003 Kidney Doantion 2006 Pt donated a kidney to his son. Coronary Artery Bypass, Five06/10/2014 Coronary artery bypass grafting x5 (left internal mammary artery to left anterior descending artery, saphenous vein graft to diagonal, saphenous vein graft to obtuse marginal 1, sequential saphenous vein graft to posterior descending and posterolateral branch of the right coronary artery). Endoscopic harvest of right leg greater saphenous vein.  Medication History (Victor BrookBeane; 910/11/20161:28 PM) Crestor (5MG Tablet, 1 Tablet Oral daily, Taken starting 01/24/2015) Active. Metoprolol Tartrate (25MG Tablet, 1 (one) Tablet Oral two times daily, Taken starting 01/24/2015) Active. Aspirin EC (81MG Tablet DR, 1 (one) Tablet DR Tablet DR Ta Oral daily, Taken starting 01/2015) Active. Clopidogrel Bisulfate (75MG Tablet, 1 (one) Tablet Tablet Oral daily, Taken starting 01/03/2015) Active. Nitrostat (0.4MG Tab Sublingual, 1 (one) Tab Sublingual Tab Sub Sublingual every 5 minutes as needed for chest pain., Taken starting 11/11/2014) Active. Losartan Potassium (25MG Tablet, 1 Oral daily, Taken starting 12/15/2014) Active. Vitamin C (1000MG Tablet, 1 Oral daily) Active. Indomethacin (50MG Capsule, 3 Oral daily as needed for gout) Active. CoQ10 (100MG  Capsule, 1 Oral TWO times daily) Active.  Fexofenadine HCl (180MG Tablet, 1 Oral Daily) Discontinued: complete course. Red Yeast Rice (600MG Tablet, 2 Oral two times daily) Active. Fish Oil (1200MG Capsule, 1 Oral two times daily) Active. Turmeric (1 Oral daily) Specific dose unknown - Active. UBIQUITOUS 46m (Free Text) (1 two times daily) Active. Magnesium (500MG Tablet, 1 Oral daily) Active. ACETYL L-CARITINE 5071m(Free Text) (1 daily) Active. Vitamin B12 (1 Oral daily) Specific dose unknown - Active. Medications Reconciled  Diagnostic Studies History (CAdonis Brookeane; 9/12-Apr-2015:17 PM) Endoscopy1980 Sleep Study2003 Positive, Pt does use CPAP Colonoscopy2011 Normal Echocardiogram03/23/2016 1. Left ventricle cavity is normal in size. Mild to moderate concentric hypertrophy of the left ventricle. Normal global wall motion. Calculated EF 62%. 2. Trace mitral regurgitation. 3. Mild tricuspid regurgitation. No evidence of pulmonary hypertension. Treadmill stress test04/13/2016 Indication: Atypical chest pain The patient exercised according to Bruce Protocol, Total time recorded 09.28 min achieving max heart rate of 145 which was 94 % of MPHR for age and 10.75METS of work. Normal BP response. There was 2 mm upsloping ST depression with peak exercise which persisted for > 25m8mtes into recovery withT changes suggestive' \of'  ischemia with exercise stress test. Stress terminated due to THR (>85% MPHR)/MPHR met and fatigue. Impression: Abnormal GXT, with positive EKG response to exercise. Nuclear stress test04/22/2016 1. The patient exercised according to Bruce Protocol, Total time recorded 07.30 min achieving max heart rate of 133 which was 86 % of MPHR for age and 9.33METS of work. Normal BP response. There was 2 mm upsloping ST depression with T changes at peak exercise which persisted for > 25mi63mes into recovery suggestive of ischemia with exercise stress test. Stress terminated due to  THR (>85% MPHR)/MPHR met and fatigue. 2. There is a moderate area of very mild ischemia in the basal inferior, mid inferior and apical inferior myocardial wall(s). The left ventricular ejection fraction was calculated or visually estimated to be 63% Coronary Angiogram04/28/2016 LVEF 55%. Severe diffuse coronary calcification. RCA tandem high-grade 90% stenosis proximal to mid and distal 80%. LAD tandem 80-90% stenosis tandem. Amenable for revascularization but continuously. Circumflex diffuse 40-50% with stenosis. Pulmonary Function Test 12/08/2014 TEE06/09/2014 LV EF: 60% -  65%  Other Problems (ChrAdonis Brookne; 9/212016-10-047 PM) Unspecified Diagnosis Acute maxillary sinusitis, recurrence not specified (J01.00) Encounter for screening for malignant neoplasm of prostate (Z12.5) Need for hepatitis A and B vaccination (Z23)    Review of Systems (JagLaverda Mcclure 9/21October 04, 20166 PM) General Present- Feeling well. Not Present- Anorexia, Fatigue and Fever. Respiratory Not Present- Cough, Decreased Exercise Tolerance and Dyspnea. Cardiovascular Present- Chest Pain and Irregular Heart Beat. Not Present- Claudications, Edema, Orthopnea, Paroxysmal Nocturnal Dyspnea and Shortness of Breath. Gastrointestinal Not Present- Change in Bowel Habits, Constipation and Nausea. Neurological Not Present- Focal Neurological Symptoms. Endocrine Not Present- Appetite Changes, Cold Intolerance and Heat Intolerance. Hematology Not Present- Anemia, Petechiae and Prolonged Bleeding.  Vitals (ChrAdonis Brookne; 03/2109-04-20160 PM) 03/2109-04-167 PM Weight: 228.31 lb Height: 70in Body Surface Area: 2.21 m Body Mass Index: 32.76 kg/m  Pulse: 67 (Regular)  P.OX: 97% (Room air) BP: 132/78 (Sitting, Left Arm, Standard)       Physical Exam (JagLaverda Mcclure; 09/210/04/20164 PM) General Mental Status-Alert. General Appearance-Cooperative, Appears stated age, Not in acute  distress. Orientation-Oriented X3. Build & Nutrition-Well built and Mildly obese.  Head and Neck Thyroid Gland Characteristics - no palpable nodules, no palpable enlargement.  Chest and Lung Exam Inspection Chest Wall - Scar - CABG/sternotomy scar. Palpation Tender - No  chest wall tenderness. Auscultation Breath sounds - Clear.  Cardiovascular Inspection Jugular vein - Right - No Distention. Auscultation Heart Sounds - S1 WNL, S2 WNL and No gallop present. Murmurs & Other Heart Sounds - Murmur - No murmur.  Abdomen Palpation/Percussion Normal exam - Non Tender and No hepatosplenomegaly. Auscultation Normal exam - Bowel sounds normal.  Peripheral Vascular Lower Extremity Inspection - Left - No Pigmentation, No Varicose veins. Right - No Pigmentation, No Varicose veins. Palpation - Edema - Left - No edema. Right - No edema. Femoral pulse - Left - Normal. Right - Normal. Popliteal pulse - Left - Normal. Right - Normal. Dorsalis pedis pulse - Left - Normal. Right - Normal. Posterior tibial pulse - Left - Normal. Right - Normal. Carotid arteries - Left-No Carotid bruit. Carotid arteries - Right-No Carotid bruit. Abdomen-No prominent abdominal aortic pulsation, No epigastric bruit.  Neurologic Motor-Grossly intact without any focal deficits.  Musculoskeletal Global Assessment Left Lower Extremity - normal range of motion without pain. Right Lower Extremity - normal range of motion without pain.    Assessment & Plan Victor Page MD; 03/30/2015 4:40 PM) Coronary artery disease involving native coronary artery of native heart with angina pectoris (I25.119) Story: CABG on 12/10/2014: 5 with LIMA to LAD, SVG to D1, this feet to OM1 and sequential SVG to PDA and PL of RCA- Dr. Dahlia Byes.  Coronary angiogram 11/04/2014: LVEF 55%. Severe diffuse coronary calcification. RCA tandem high-grade 90% stenosis proximal to mid and distal 80%. Collaterals to the RCA  from left. LAD tandem 80-90% stenosis tandem. Circumflex diffuse 40-50% with stenosis.  Exercise sestamibi stress test 10/29/2014: 1. The patient exercised according to Bruce Protocol, Total time recorded 07.30 min achieving max heart rate of 133 which was 86 % of MPHR for age and 9.33METS of work. Normal BP response. There was 2 mm upsloping ST depression with T changes at peak exercise which persisted for > 31mnutes into recovery suggestive of ischemia with exercise stress test. Stress terminated due to THR (>85% MPHR)/MPHR met and fatigue. 2. There is a moderate area of very mild ischemia in the basal inferior, mid inferior and apical inferior myocardial wall(s). The left ventricular ejection fraction was calculated or visually estimated to be 63%. Impression: EKG 03/30/2015: Sinus rhythm with borderline first-degree AV block, normal axis, nonspecific sagging ST segment depression in inferior leads. Normal QT interval.  EKG 01/24/2015: Sinus rhythm at a rate of 62 bpm, normal axis, normal intervals, no evidence of ischemia. 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 PDahlia Byes Benign essential hypertension (I10) Story: Echo- 09/29/2014 1. Left ventricle cavity is normal in size. Mild to moderate concentric hypertrophy of the left ventricle. Normal global wall motion. Calculated EF 62%. 2. Trace mitral regurgitation. Mild tricuspid regurgitation. No evidence of pulmonary hypertension. Hypercholesteremia (E78.0) Story: Lipid profile 09/29/2014: Total cholesterol 200, triglycerides 193, HDL 38, LDL 123. Serum glucose 100 mg, BUN 18, serum creatinine 1.44. EGFR 50 mL. Potassium 4.7. Current Plans LIPOPROTEIN, BLD, BY NMR (816109 Chronic kidney disease, stage 3 (N18.3) Story: Labs 09/29/2014: Serum glucose 100 mg, BUN 18,  serum creatinine 1.44. EGFR 50 mL. Potassium 4.7. Current Plans Mechanism of underlying disease process and action of medications discussed with the patient. I discussed primary/secondary prevention and also dietary counceling was done. Patient called our office to  make an appointment as he has been having exertional chest pain associated with tachycardia, similar symptoms prior to his CABG, hence made an appointment.  Although EKG does not reveal any significant changes, there is sagging ST depression in the inferior lead and his symptoms are fairly classic for new onset angina pectoris since his CABG. I have recommended to proceed with directly coronary angiography to evaluate for graft failure. I also gave him the option of stress testing, patient states that he is very sure that his were to have an abnormality as his symptoms are returning CABG. He is already on maximal medical therapy including being on clopidogrel since CABG. Patient understands the risks and benefits of coronary angiography and is willing to proceed. He has nitroglycerin and advised him to use it as necessary.  Patient instructed not to do heavy lifting, heavy exertional activity, swimming until evaluation is complete. Patient instructed to call if symptoms worse or to go to the ED for further evaluation. 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 He has severe intolerance to statins, has been tolerating low-dose of Crestor. Will also repeat lipids.  Angina pectoris (I20.9) Current Plans METABOLIC PANEL, BASIC (16109) CBC & PLATELETS (AUTO) (60454) PT (PROTHROMBIN TIME) (09811)   Signed by Victor Page, MD (03/30/2015 4:40 PM)

## 2015-04-04 ENCOUNTER — Encounter (HOSPITAL_COMMUNITY): Payer: PPO

## 2015-04-06 ENCOUNTER — Encounter (HOSPITAL_COMMUNITY): Payer: PPO

## 2015-04-07 ENCOUNTER — Ambulatory Visit (HOSPITAL_COMMUNITY)
Admission: RE | Admit: 2015-04-07 | Discharge: 2015-04-08 | Disposition: A | Discharge status: Home / Self Care | Payer: PPO | Source: Ambulatory Visit | Attending: Cardiology | Admitting: Cardiology

## 2015-04-07 ENCOUNTER — Encounter (HOSPITAL_COMMUNITY): Payer: Self-pay | Admitting: Cardiology

## 2015-04-07 ENCOUNTER — Encounter (HOSPITAL_COMMUNITY)
Admission: RE | Disposition: A | Discharge status: Home / Self Care | Payer: Self-pay | Source: Ambulatory Visit | Attending: Cardiology

## 2015-04-07 DIAGNOSIS — I10 Essential (primary) hypertension: Secondary | ICD-10-CM | POA: Diagnosis not present

## 2015-04-07 DIAGNOSIS — I2582 Chronic total occlusion of coronary artery: Secondary | ICD-10-CM | POA: Insufficient documentation

## 2015-04-07 DIAGNOSIS — I25718 Atherosclerosis of autologous vein coronary artery bypass graft(s) with other forms of angina pectoris: Secondary | ICD-10-CM | POA: Diagnosis not present

## 2015-04-07 DIAGNOSIS — I129 Hypertensive chronic kidney disease with stage 1 through stage 4 chronic kidney disease, or unspecified chronic kidney disease: Secondary | ICD-10-CM | POA: Insufficient documentation

## 2015-04-07 DIAGNOSIS — Z905 Acquired absence of kidney: Secondary | ICD-10-CM | POA: Diagnosis not present

## 2015-04-07 DIAGNOSIS — I25118 Atherosclerotic heart disease of native coronary artery with other forms of angina pectoris: Secondary | ICD-10-CM | POA: Diagnosis not present

## 2015-04-07 DIAGNOSIS — E785 Hyperlipidemia, unspecified: Secondary | ICD-10-CM | POA: Insufficient documentation

## 2015-04-07 DIAGNOSIS — I2510 Atherosclerotic heart disease of native coronary artery without angina pectoris: Secondary | ICD-10-CM | POA: Diagnosis present

## 2015-04-07 DIAGNOSIS — Z951 Presence of aortocoronary bypass graft: Secondary | ICD-10-CM

## 2015-04-07 DIAGNOSIS — N182 Chronic kidney disease, stage 2 (mild): Secondary | ICD-10-CM | POA: Insufficient documentation

## 2015-04-07 DIAGNOSIS — Z9861 Coronary angioplasty status: Secondary | ICD-10-CM

## 2015-04-07 HISTORY — DX: Basal cell carcinoma of skin of nose: C44.311

## 2015-04-07 HISTORY — PX: CARDIAC CATHETERIZATION: SHX172

## 2015-04-07 LAB — POCT ACTIVATED CLOTTING TIME: Activated Clotting Time: 540 seconds

## 2015-04-07 SURGERY — LEFT HEART CATH AND CORS/GRAFTS ANGIOGRAPHY
Anesthesia: LOCAL

## 2015-04-07 MED ORDER — MIDAZOLAM HCL 2 MG/2ML IJ SOLN
INTRAMUSCULAR | Status: AC
  Filled 2015-04-07: qty 4

## 2015-04-07 MED ORDER — SODIUM CHLORIDE 0.9 % IJ SOLN: 3.0000 mL | Freq: Two times a day (BID) | INTRAMUSCULAR | Status: DC

## 2015-04-07 MED ORDER — ONDANSETRON HCL 4 MG/2ML IJ SOLN: 4.0000 mg | Freq: Four times a day (QID) | INTRAMUSCULAR | Status: DC | PRN

## 2015-04-07 MED ORDER — ASPIRIN 81 MG PO CHEW
CHEWABLE_TABLET | ORAL | Status: AC
  Filled 2015-04-07: qty 1

## 2015-04-07 MED ORDER — SODIUM CHLORIDE 0.9 % IJ SOLN: 3.0000 mL | INTRAMUSCULAR | Status: DC | PRN

## 2015-04-07 MED ORDER — SODIUM CHLORIDE 0.9 % IV SOLN
250.0000 mg | INTRAVENOUS | Status: DC | PRN
  Administered 2015-04-07: 1.75 mg/kg/h via INTRAVENOUS

## 2015-04-07 MED ORDER — HEPARIN (PORCINE) IN NACL 2-0.9 UNIT/ML-% IJ SOLN
INTRAMUSCULAR | Status: AC
  Filled 2015-04-07: qty 1000

## 2015-04-07 MED ORDER — VERAPAMIL HCL 2.5 MG/ML IV SOLN
INTRAVENOUS | Status: AC
  Filled 2015-04-07: qty 2

## 2015-04-07 MED ORDER — HEPARIN SODIUM (PORCINE) 1000 UNIT/ML IJ SOLN
INTRAMUSCULAR | Status: DC | PRN
  Administered 2015-04-07: 5000 [IU] via INTRAVENOUS

## 2015-04-07 MED ORDER — MAGNESIUM 500 MG PO CAPS: 500.0000 mg | ORAL_CAPSULE | Freq: Every day | ORAL | Status: DC

## 2015-04-07 MED ORDER — SODIUM CHLORIDE 0.9 % WEIGHT BASED INFUSION
3.0000 mL/kg/h | INTRAVENOUS | Status: DC
  Administered 2015-04-07: 3 mL/kg/h via INTRAVENOUS

## 2015-04-07 MED ORDER — ANGIOPLASTY BOOK
Freq: Once | Status: AC
  Administered 2015-04-07: 20:00:00
  Filled 2015-04-07: qty 1

## 2015-04-07 MED ORDER — ASPIRIN EC 81 MG PO TBEC: 81.0000 mg | DELAYED_RELEASE_TABLET | Freq: Every day | ORAL | Status: DC

## 2015-04-07 MED ORDER — ROSUVASTATIN CALCIUM 5 MG PO TABS
5.0000 mg | ORAL_TABLET | Freq: Every evening | ORAL | Status: DC
  Administered 2015-04-07: 5 mg via ORAL
  Filled 2015-04-07 (×2): qty 1

## 2015-04-07 MED ORDER — METOPROLOL TARTRATE 12.5 MG HALF TABLET
25.0000 mg | ORAL_TABLET | Freq: Two times a day (BID) | ORAL | Status: DC
  Administered 2015-04-07 – 2015-04-08 (×2): 25 mg via ORAL
  Filled 2015-04-07 (×3): qty 2

## 2015-04-07 MED ORDER — HEPARIN SODIUM (PORCINE) 1000 UNIT/ML IJ SOLN
INTRAMUSCULAR | Status: AC
  Filled 2015-04-07: qty 1

## 2015-04-07 MED ORDER — SODIUM CHLORIDE 0.9 % IV SOLN: 250.0000 mL | INTRAVENOUS | Status: DC | PRN

## 2015-04-07 MED ORDER — ACETAMINOPHEN 325 MG PO TABS: 650.0000 mg | ORAL_TABLET | ORAL | Status: DC | PRN

## 2015-04-07 MED ORDER — ADULT MULTIVITAMIN W/MINERALS CH
1.0000 | ORAL_TABLET | Freq: Every day | ORAL | Status: DC
  Administered 2015-04-08: 1 via ORAL
  Filled 2015-04-07: qty 1

## 2015-04-07 MED ORDER — SODIUM CHLORIDE 0.9 % WEIGHT BASED INFUSION: 1.0000 mL/kg/h | INTRAVENOUS | Status: DC

## 2015-04-07 MED ORDER — TURMERIC 500 MG PO CAPS: 750.0000 mg | ORAL_CAPSULE | Freq: Every day | ORAL | Status: DC

## 2015-04-07 MED ORDER — BIVALIRUDIN BOLUS VIA INFUSION - CUPID
INTRAVENOUS | Status: DC | PRN
  Administered 2015-04-07: 74.85 mg via INTRAVENOUS

## 2015-04-07 MED ORDER — LIDOCAINE HCL (PF) 1 % IJ SOLN
INTRAMUSCULAR | Status: AC
  Filled 2015-04-07: qty 30

## 2015-04-07 MED ORDER — VERAPAMIL HCL 2.5 MG/ML IV SOLN
INTRA_ARTERIAL | Status: DC | PRN
  Administered 2015-04-07: 8 mL via INTRA_ARTERIAL

## 2015-04-07 MED ORDER — IOHEXOL 350 MG/ML SOLN
INTRAVENOUS | Status: DC | PRN
  Administered 2015-04-07: 135 mL via INTRA_ARTERIAL

## 2015-04-07 MED ORDER — VITAMIN C 500 MG PO TABS
1000.0000 mg | ORAL_TABLET | Freq: Every day | ORAL | Status: DC
  Administered 2015-04-08: 09:00:00 1000 mg via ORAL
  Filled 2015-04-07: qty 2

## 2015-04-07 MED ORDER — BIVALIRUDIN 250 MG IV SOLR
INTRAVENOUS | Status: AC
  Filled 2015-04-07: qty 250

## 2015-04-07 MED ORDER — CLOPIDOGREL BISULFATE 75 MG PO TABS
75.0000 mg | ORAL_TABLET | Freq: Every day | ORAL | Status: DC
  Administered 2015-04-07: 75 mg via ORAL
  Filled 2015-04-07: qty 1

## 2015-04-07 MED ORDER — MIDAZOLAM HCL 2 MG/2ML IJ SOLN
INTRAMUSCULAR | Status: DC | PRN
  Administered 2015-04-07: 2 mg via INTRAVENOUS

## 2015-04-07 MED ORDER — INDOMETHACIN 50 MG PO CAPS: 50.0000 mg | ORAL_CAPSULE | Freq: Three times a day (TID) | ORAL | Status: DC | PRN

## 2015-04-07 MED ORDER — RED YEAST RICE 600 MG PO CAPS: 1200.0000 mg | ORAL_CAPSULE | Freq: Two times a day (BID) | ORAL | Status: DC

## 2015-04-07 MED ORDER — NITROGLYCERIN 1 MG/10 ML FOR IR/CATH LAB
INTRA_ARTERIAL | Status: AC
  Filled 2015-04-07: qty 10

## 2015-04-07 MED ORDER — CO Q 10 100 MG PO CAPS: 100.0000 mg | ORAL_CAPSULE | Freq: Every day | ORAL | Status: DC

## 2015-04-07 MED ORDER — HYDROMORPHONE HCL 1 MG/ML IJ SOLN
INTRAMUSCULAR | Status: DC | PRN
  Administered 2015-04-07: 0.5 mg via INTRAVENOUS

## 2015-04-07 MED ORDER — ASPIRIN 81 MG PO CHEW
81.0000 mg | CHEWABLE_TABLET | ORAL | Status: AC
  Administered 2015-04-07: 81 mg via ORAL

## 2015-04-07 MED ORDER — HYDROMORPHONE HCL 1 MG/ML IJ SOLN
INTRAMUSCULAR | Status: AC
  Filled 2015-04-07: qty 1

## 2015-04-07 MED ORDER — LOSARTAN POTASSIUM 25 MG PO TABS
25.0000 mg | ORAL_TABLET | Freq: Every day | ORAL | Status: DC
  Administered 2015-04-07: 25 mg via ORAL
  Filled 2015-04-07 (×2): qty 1

## 2015-04-07 SURGICAL SUPPLY — 17 items
BALLN EUPHORA RX 3.0X30 (BALLOONS) ×2
BALLOON EUPHORA RX 3.0X30 (BALLOONS) IMPLANT
CATH INFINITI 5 FR IM (CATHETERS) ×1 IMPLANT
CATH INFINITI 5 FR LCB (CATHETERS) ×1 IMPLANT
CATH INFINITI 5FR AL1 (CATHETERS) ×1 IMPLANT
CATH OPTITORQUE TIG 4.0 5F (CATHETERS) ×2 IMPLANT
CATH VISTA GUIDE 6FR JR4 (CATHETERS) ×1 IMPLANT
DEVICE RAD COMP TR BAND LRG (VASCULAR PRODUCTS) ×2 IMPLANT
GLIDESHEATH SLEND A-KIT 6F 20G (SHEATH) ×2 IMPLANT
KIT ENCORE 26 ADVANTAGE (KITS) ×1 IMPLANT
KIT HEART LEFT (KITS) ×2 IMPLANT
PACK CARDIAC CATHETERIZATION (CUSTOM PROCEDURE TRAY) ×2 IMPLANT
STENT RESOLUTE INTEG 3.5X38 (Permanent Stent) ×1 IMPLANT
TRANSDUCER W/STOPCOCK (MISCELLANEOUS) ×2 IMPLANT
TUBING CIL FLEX 10 FLL-RA (TUBING) ×2 IMPLANT
WIRE COUGAR XT STRL 190CM (WIRE) ×1 IMPLANT
WIRE SAFE-T 1.5MM-J .035X260CM (WIRE) ×2 IMPLANT

## 2015-04-07 NOTE — Interval H&P Note (Signed)
History and Physical Interval Note:  04/07/2015 6:18 AM  Victor Mcclure  has presented today for surgery, with the diagnosis of cp/cad  The various methods of treatment have been discussed with the patient and family. After consideration of risks, benefits and other options for treatment, the patient has consented to  Procedure(s): Left Heart Cath and Cors/Grafts Angiography (N/A)  And possible PCI as a surgical intervention .  The patient's history has been reviewed, patient examined, no change in status, stable for surgery.  I have reviewed the patient's chart and labs.  Questions were answered to the patient's satisfaction.    2012 Appropriate Use Criteria for Coronary Revascularization   A conclusive assessment cannot be generated from this patient's information. Cath Lab Visit (complete for each Cath Lab visit)  Clinical Evaluation Leading to the Procedure:   ACS: No.  Non-ACS:    Anginal Classification: CCS II  Anti-ischemic medical therapy: Maximal Therapy (2 or more classes of medications)  Non-Invasive Test Results: No non-invasive testing performed  Prior CABG: Previous CABG        Adrian Prows

## 2015-04-07 NOTE — Progress Notes (Signed)
TR BAND REMOVAL  LOCATION:    left radial  DEFLATED PER PROTOCOL:    Yes.    TIME BAND OFF / DRESSING APPLIED:    1315   SITE UPON ARRIVAL:    Level 0  SITE AFTER BAND REMOVAL:    Level 0  CIRCULATION SENSATION AND MOVEMENT:    Within Normal Limits   Yes.    COMMENTS:   Checked frequently after dressing applied with no change in assessment, dressing remains dry and intact.

## 2015-04-07 NOTE — Progress Notes (Signed)
PHARMACIST - PHYSICIAN ORDER COMMUNICATION  CONCERNING: P&T Medication Policy on Herbal Medications  DESCRIPTION:  This patient's order for:  Tumeric, Red Yeast Rice, and Tumeric  has been noted.  This product(s) is classified as an "herbal" or natural product. Due to a lack of definitive safety studies or FDA approval, nonstandard manufacturing practices, plus the potential risk of unknown drug-drug interactions while on inpatient medications, the Pharmacy and Therapeutics Committee does not permit the use of "herbal" or natural products of this type within [facility].   ACTION TAKEN: The pharmacy department is unable to verify this order at this time and your patient has been informed of this safety policy. Please reevaluate patient's clinical condition at discharge and address if the herbal or natural product(s) should be resumed at that time.   Thank you,  Reatha Harps, Pharm.D., BCPS Clinical Pharmacist 04/07/2015 10:42 AM

## 2015-04-08 ENCOUNTER — Encounter (HOSPITAL_COMMUNITY): Payer: PPO

## 2015-04-08 DIAGNOSIS — Z951 Presence of aortocoronary bypass graft: Secondary | ICD-10-CM | POA: Diagnosis not present

## 2015-04-08 DIAGNOSIS — E785 Hyperlipidemia, unspecified: Secondary | ICD-10-CM | POA: Diagnosis not present

## 2015-04-08 DIAGNOSIS — I25118 Atherosclerotic heart disease of native coronary artery with other forms of angina pectoris: Secondary | ICD-10-CM | POA: Diagnosis not present

## 2015-04-08 DIAGNOSIS — I2582 Chronic total occlusion of coronary artery: Secondary | ICD-10-CM | POA: Diagnosis not present

## 2015-04-08 LAB — CBC
HCT: 41.9 % (ref 39.0–52.0)
HEMOGLOBIN: 13.6 g/dL (ref 13.0–17.0)
MCH: 28.2 pg (ref 26.0–34.0)
MCHC: 32.5 g/dL (ref 30.0–36.0)
MCV: 86.7 fL (ref 78.0–100.0)
Platelets: 149 10*3/uL — ABNORMAL LOW (ref 150–400)
RBC: 4.83 MIL/uL (ref 4.22–5.81)
RDW: 14.6 % (ref 11.5–15.5)
WBC: 6 10*3/uL (ref 4.0–10.5)

## 2015-04-08 LAB — BASIC METABOLIC PANEL
ANION GAP: 9 (ref 5–15)
BUN: 14 mg/dL (ref 6–20)
CALCIUM: 8.9 mg/dL (ref 8.9–10.3)
CO2: 25 mmol/L (ref 22–32)
Chloride: 103 mmol/L (ref 101–111)
Creatinine, Ser: 1.15 mg/dL (ref 0.61–1.24)
Glucose, Bld: 98 mg/dL (ref 65–99)
Potassium: 3.9 mmol/L (ref 3.5–5.1)
Sodium: 137 mmol/L (ref 135–145)

## 2015-04-08 MED ORDER — ROSUVASTATIN CALCIUM 40 MG PO TABS: 40.0000 mg | ORAL_TABLET | Freq: Every evening | ORAL | Status: DC

## 2015-04-08 MED ORDER — ROSUVASTATIN CALCIUM 5 MG PO TABS: 5.0000 mg | ORAL_TABLET | Freq: Every evening | ORAL | Status: DC

## 2015-04-08 NOTE — Discharge Summary (Signed)
Physician Discharge Summary  Patient ID: Victor Mcclure MRN: [id number] DOB/AGE: 1945-11-14 69 y.o.  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 H/O CABG 2. Hyperlipidemia 3. Hypertension 4. 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 Severe diffuse native vessel disease, mid circumflex 70%, mid LAD 80%, proximal and mid RCA 90%, severely diffusely diseased between 70-90% stenosis in the distal right, occluded large PDA/PLB branch, superdominant RCA. 2. SVG to PDA and PL, SVG to circumflex occluded. LIMA to LAD patent. 3. Large collaterals to the occluded PDA and PL branch of the right coronary artery, still residual secondary PDA branches unprotected with a high-grade stenosis in a very large RCA. 4. Preserved LVEF 55-60%. 5. Successful PTCA and stenting of the proximal and mid RCA with implantation of a 3.5 x 38 mm resolute DES.  Hospital Course: Victor Mcclure Patient is a 69 year old Caucasian male with history of known severe diffuse coronary artery disease, word undergone CABG on 12/10/2014 with LIMA to LAD, SVG to D1, SVG to sequential PDA and PL of RCA, by Dr. Dahlia Byes. He had been doing well until 2 weeks ago started having exertional chest discomfort very similar to his angina pectoris prior to CABG. Due to typical presentation of angina pectoris, early graft failure was thought to be the etiology, hence as per his preference and after discussions regarding his clinical presentation, we decided to bring him back directly to the coronary angiography suite to evaluate his coronary anatomy.  Patient with vein graft loss to the circumflex and right coronary artery. He underwent successful PTCA and stenting of the proximal and mid RCA with implantation of a 3.5 x 38 mm resolute DES. Distal RCA is  severely diffusely diseased, hence the distal lesions were left alone, proximal segment was angioplastied and expect improvement in his symptoms.   Recommendations on discharge:  Follow up outpatient as scheduled next week. If symptoms of angina persist, will consider FFR guided angioplasty to the circumflex coronary artery. We had a long discussion regarding statins and red yeast rice. Pt states he hasn't actually experienced any side effects on statins, he is just afraid of the side effects he has read about. After a lengthy discussion, he is willing to begin high dose Crestor.   Discharge Exam: Blood pressure 127/82, pulse 57, temperature 97.7 F (36.5 C), temperature source Oral, resp. rate 18, height 5' 10.5" (1.791 m), weight 104.6 kg (230 lb 9.6 oz), SpO2 97 %.    General appearance: alert, cooperative, appears stated age and mildly obese Resp: clear to auscultation bilaterally Chest wall: no tenderness Cardio: regular rate and rhythm, S1, S2 normal, no murmur, click, rub or gallop Extremities: extremities normal, atraumatic, no cyanosis or edema Pulses: 2+ and symmetric right radial access site asymptomatic  Labs:   Lab Results  Component Value Date   WBC 6.0 04/08/2015   HGB 13.6 04/08/2015   HCT 41.9 04/08/2015   MCV 86.7 04/08/2015   PLT 149* 04/08/2015    Recent Labs Lab 04/08/15 0305  NA 137  K 3.9  CL 103  CO2 25  BUN 14  CREATININE 1.15  CALCIUM 8.9  GLUCOSE 98    Lipid Panel  No results found for: CHOL, TRIG, HDL, CHOLHDL, VLDL, LDLCALC  BNP (last 3 results) No results for input(s): BNP in the last 8760 hours.  ProBNP (last 3 results)  No results for input(s): PROBNP in the last 8760 hours.  HEMOGLOBIN A1C Lab Results  Component Value Date   HGBA1C 5.6 12/08/2014   MPG 114 12/08/2014    Cardiac Panel (last 3 results) No results for input(s): CKTOTAL, CKMB, TROPONINI, RELINDX in the last 8760 hours.  No results found for: CKTOTAL, CKMB,  CKMBINDEX, TROPONINI   TSH No results for input(s): TSH in the last 8760 hours.  EKG 04/08/2015: sinus bradycardia at a rate of 58 bpm with borderline 1st degree AV block, normal axis, no evidence of ischemia.  Radiology: No results found.    FOLLOW UP PLANS AND APPOINTMENTS    Medication List    STOP taking these medications        amiodarone 200 MG tablet  Commonly known as:  PACERONE     CHERRY CONCENTRATE PO     fexofenadine 180 MG tablet  Commonly known as:  ALLEGRA     RED YEAST RICE PO      TAKE these medications        aspirin EC 81 MG tablet  Take 81 mg by mouth daily.  Notes to Patient:  Prevents clotting in stent and heart attack     clopidogrel 75 MG tablet  Commonly known as:  PLAVIX  Take 75 mg by mouth daily.  Notes to Patient:  Prevents clotting in stent and heart attack      Co Q 10 100 MG Caps  Take 100 mg by mouth daily.     FISH OIL PO  Take 1 tablet by mouth daily.     indomethacin 50 MG capsule  Commonly known as:  INDOCIN  Take 50 mg by mouth 3 (three) times daily as needed for mild pain or moderate pain.     losartan 25 MG tablet  Commonly known as:  COZAAR  Take 1 tablet (25 mg total) by mouth daily.  Notes to Patient:  Blood pressure     Magnesium 500 MG Caps  Take 500 mg by mouth daily.     metoprolol tartrate 25 MG tablet  Commonly known as:  LOPRESSOR  Take 1 tablet (25 mg total) by mouth 2 (two) times daily.  Notes to Patient:  Decreases work of the heart/heart rate/blood pressure     multivitamin tablet  Take 1 tablet by mouth daily.     NON FORMULARY  Take 1 oz by mouth 2 (two) times daily. Braggs whole Apple Cider Vinegar     NON FORMULARY  daily. CPAP     rosuvastatin 40 MG tablet  Commonly known as:  CRESTOR  Take 1 tablet (40 mg total) by mouth every evening.  Notes to Patient:  Cholesterol      TURMERIC PO  Take 750 mg by mouth daily.     vitamin C 1000 MG tablet  Take 1,000 mg by mouth daily.            Follow-up Information    Follow up with Adrian Prows, MD On 04/15/2015.   Specialty:  Cardiology   Why:  at 2:45pm   Contact information:   [address] [place] [address] [phone]        Rachel Bo, NP-C 04/08/2015, 8:19 AM [facility] Pager: [phone] Office: [phone]

## 2015-04-08 NOTE — Progress Notes (Signed)
[id number] Pt up walking independently with steady gait. BP 127/82 after walk. Pt stated chest tightness 1-2/10 with walk. Stated if he had walked this much within last week at same pace the tightness would have been worse. Discussed importance of plavix with stent, reviewed NTG use, and reviewed heart healthy diet choices and ex. Pt will allow referral to Phase 2 [place] but does not want to attend as he tried after CABG and he felt program was not for him. Likes to exercise on his own. Voiced disappointment of having to have procedure so soon after surgery. Emotional support given. Graylon Good RN BSN 04/08/2015 8:47 AM

## 2015-04-11 ENCOUNTER — Encounter (HOSPITAL_COMMUNITY): Payer: PPO

## 2015-04-13 ENCOUNTER — Encounter (HOSPITAL_COMMUNITY): Payer: PPO

## 2015-04-15 ENCOUNTER — Encounter (HOSPITAL_COMMUNITY): Payer: PPO

## 2015-04-18 ENCOUNTER — Ambulatory Visit (INDEPENDENT_AMBULATORY_CARE_PROVIDER_SITE_OTHER): Payer: PPO | Admitting: Podiatry

## 2015-04-18 ENCOUNTER — Encounter: Payer: Self-pay | Admitting: Podiatry

## 2015-04-18 ENCOUNTER — Encounter (HOSPITAL_COMMUNITY): Payer: PPO

## 2015-04-18 ENCOUNTER — Ambulatory Visit (INDEPENDENT_AMBULATORY_CARE_PROVIDER_SITE_OTHER): Payer: PPO

## 2015-04-18 VITALS — BP 149/92 | HR 60 | Resp 18

## 2015-04-18 DIAGNOSIS — D492 Neoplasm of unspecified behavior of bone, soft tissue, and skin: Secondary | ICD-10-CM

## 2015-04-18 DIAGNOSIS — R52 Pain, unspecified: Secondary | ICD-10-CM

## 2015-04-18 NOTE — Progress Notes (Signed)
Patient ID: Victor Mcclure, male   DOB: 12-24-1945, 69 y.o.   MRN: [id number]  Subjective: 69 year old male presents the office today for concerns of the soft tissue mass on the outside portion of his right ankle. He states his been there for approximately 10 years. 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 But have surgery to remove the mass. He did recently undergo cardiac stenting proximally one month ago. He's had tried shoe gear modifications and padding of the mass without much relief of symptoms. He said the pressure irritates the area. No other complaints at this time in no acute changes.  Objective:  AAO 3, NAD Neurovascular status intact. On the lateral aspect of the right ankle distal fibula there is a somewhat firm, mobile soft tissue mass measuring 53 4 x 2 cm. There is no significant tenderness palpation overlying the area and there is more painful with shoe gear and pressure. There is no overlying skin changes. No erythema. No lesions or pre-ulcerative lesions. There is no pain with ankle joint range of motion. MMT 5/5, ROM WNL. No other open lesions or pre-ulcerative lesions identified bilaterally. There is no pain with calf compression, swelling, warmth, erythema.  Assessment: 69 year old male presents with soft tissue mass right lateral ankle  Plan: -X-rays were obtained and reviewed with the patient. Soft tissue mass is present on the lateral aspect of the distal fibula. There is erosive changes to the lateral portion of the fibula consistent with chronic gout. -Treatment options discussed including all alternatives, risks, and complications -I discussed with him conservative versus surgical treatment. He will to pursue surgical intervention. Discussed some surgical intervention including the incision placement as well as postoperative course. He states that he felt this would be a very minor procedure they're very small incision. I discussed the expected  postoperative course and incision placement. Also prior to surgery we'll need clearance from his cardiologist as he recently had stents. I would prefer him to be off of aspirin and Plavix prior to surgery. He was still on scheduling surgery at this time and will call later for gait. If he wishes to pursue surgery I will need to see him again for surgical consent. Call with any questions/concerns.  Celesta Gentile, DPM

## 2015-04-20 ENCOUNTER — Ambulatory Visit: Payer: PPO | Admitting: Cardiothoracic Surgery

## 2015-04-20 ENCOUNTER — Encounter (HOSPITAL_COMMUNITY): Payer: PPO

## 2015-04-22 ENCOUNTER — Encounter (HOSPITAL_COMMUNITY): Payer: PPO

## 2015-05-04 ENCOUNTER — Ambulatory Visit (INDEPENDENT_AMBULATORY_CARE_PROVIDER_SITE_OTHER): Payer: PPO | Admitting: Cardiothoracic Surgery

## 2015-05-04 ENCOUNTER — Encounter: Payer: Self-pay | Admitting: Cardiothoracic Surgery

## 2015-05-04 VITALS — BP 133/78 | HR 58 | Resp 16 | Ht 70.5 in | Wt 230.0 lb

## 2015-05-04 DIAGNOSIS — I2510 Atherosclerotic heart disease of native coronary artery without angina pectoris: Secondary | ICD-10-CM | POA: Diagnosis not present

## 2015-05-04 DIAGNOSIS — Z951 Presence of aortocoronary bypass graft: Secondary | ICD-10-CM | POA: Diagnosis not present

## 2015-05-04 NOTE — Progress Notes (Signed)
PCP is Woody Seller, MD Referring Provider is Adrian Prows, MD  Chief Complaint  Patient presents with  . Routine Post Op    3 month f/u     HPI: Patient returns for routine followup after multivessel CABGx 5  severalmonths ago. The patient had suboptimal vein conduit and poor targets for grafting. He septally had some recurrent angina and had catheterization and PCI of the proximal RCA for closure of the distal RCA grafts.left IMA graft is patent to the LAD. He is on Plavix and Crestor and has no recurrent angina Patient has no restrictions on activity from his previous surgery earlier this year. Past Medical History  Diagnosis Date  . Fatigue   . Chest pressure   . Sinusitis   . Donor of kidney for transplant 2006  . Hyperlipemia   . Diaphragmatic hernia   . Hypertension   . Lower back pain   . Obesity   . Myalgia and myositis   . Pain in joint, pelvic region and thigh   . Shoulder pain   . Coronary artery disease   . Solitary kidney   . GERD (gastroesophageal reflux disease)   . Basal cell carcinoma of left side of nose 2012  . Complication of anesthesia     "kind of wild walking up; don't come out of it very well"   . Family history of adverse reaction to anesthesia     "son is kind of wild walking up; don't come out of it very well"  . OSA on CPAP   . History of hiatal hernia   . Chronic tophaceous gout   . Acute renal insufficiency     "I gave one kidney away"    Past Surgical History  Procedure Laterality Date  . Left heart catheterization with coronary angiogram N/A 11/04/2014    Procedure: LEFT HEART CATHETERIZATION WITH CORONARY ANGIOGRAM;  Surgeon: Adrian Prows, MD;  Location: [facility] C[facility] LAB;  Service: Cardiovascular;  Laterality: N/A;  . Kidney donation  2006  . Repair peroneal tendons ankle Right 2003?  Marland Kitchen Tee without cardioversion N/A 12/10/2014    Procedure: TRANSESOPHAGEAL ECHOCARDIOGRAM (TEE);  Surgeon: Ivin Poot, MD;  Location: [place];  Service: Open  Heart Surgery;  Laterality: N/A;  . Tonsillectomy and adenoidectomy    . Basal cell carcinoma excision Left 2012    "side of nose"  . Cardiac catheterization  10/2014  . Coronary angioplasty    . Cardiac catheterization N/A 04/07/2015    Procedure: Left Heart Cath and Cors/Grafts Angiography;  Surgeon: Adrian Prows, MD;  Location: [place] CV LAB;  Service: Cardiovascular;  Laterality: N/A;  . Coronary artery bypass graft N/A 12/10/2014    Procedure: CORONARY ARTERY BYPASS GRAFTING (CABG)x 5 -LIMA to LAD -SVG to DIAGONAL -SVG to OM -SEQUENTIAL SVG TO PDA, PLB;  Surgeon: Ivin Poot, MD;  Location: [place];  Service: Open Heart Surgery;  Laterality: N/A;    Family History  Problem Relation Age of Onset  . Heart disease Mother   . Heart attack Father   . Stroke Neg Hx     Social History Social History  Substance Use Topics  . Smoking status: Never Smoker   . Smokeless tobacco: Never Used  . Alcohol Use: 1.2 oz/week    2 Glasses of wine, 0 Standard drinks or equivalent per week    Current Outpatient Prescriptions  Medication Sig Dispense Refill  . Ascorbic Acid (VITAMIN C) 1000 MG tablet Take 1,000 mg by mouth daily.    Marland Kitchen  aspirin EC 81 MG tablet Take 81 mg by mouth daily.    . clopidogrel (PLAVIX) 75 MG tablet Take 75 mg by mouth daily.    . Coenzyme Q10 (CO Q 10) 100 MG CAPS Take 100 mg by mouth daily.    . indomethacin (INDOCIN) 50 MG capsule Take 50 mg by mouth 3 (three) times daily as needed for mild pain or moderate pain.    Marland Kitchen losartan (COZAAR) 25 MG tablet Take 1 tablet (25 mg total) by mouth daily. 30 tablet 3  . Magnesium 500 MG CAPS Take 500 mg by mouth daily.    . metoprolol tartrate (LOPRESSOR) 25 MG tablet Take 1 tablet (25 mg total) by mouth 2 (two) times daily. 60 tablet 3  . Multiple Vitamin (MULTIVITAMIN) tablet Take 1 tablet by mouth daily.    . NON FORMULARY Take 1 oz by mouth 2 (two) times daily. Braggs whole Apple Cider Vinegar    . NON FORMULARY daily. CPAP     . Omega-3 Fatty Acids (FISH OIL PO) Take 1 tablet by mouth daily.    . rosuvastatin (CRESTOR) 40 MG tablet Take 1 tablet (40 mg total) by mouth every evening. 30 tablet 1  . TURMERIC PO Take 750 mg by mouth daily.     No current facility-administered medications for this visit.    Allergies  Allergen Reactions  . Statins     Joint ache. Tolerating Crestor. Does not want to use a statin.      Review of Systems  No problems with surgical incisions  BP 133/78 mmHg  Pulse 58  Resp 16  Ht 5' 10.5" (1.791 m)  Wt 230 lb (104.327 kg)  BMI 32.52 kg/m2  SpO2 97% Physical Exam Alert and comfortable Heart rate regular  Diagnostic Tests: Most recent cardiac catheterization-arteriograms personally reviewed Impression: Coronary artery disease status post CABG with subsequent PCI of the RCA for graft closure because of small vein conduit and poor distal targets.situation discussed at length with patient including the issues of reduced vein graft flow because of poor runoff in small diseased vessels or because of competitive flow which can result in pain graft occlusion.  Plan:return as needed.   Len Childs, MD [facility] [phone]

## 2015-05-09 ENCOUNTER — Telehealth: Payer: Self-pay | Admitting: Podiatry

## 2015-05-09 NOTE — Telephone Encounter (Signed)
Pt called to check status of clearance letter from cardiologist for pt to have surgery. Pt states it has been 3 wks and he has not heard anything.Pt asked if we could follow up with cardiologist.

## 2015-05-10 NOTE — Telephone Encounter (Signed)
I'm returning your call.  Dr. Einar Gip said you cannot stop the Aspirin nor Plavix.  Do you want to proceed with surgery?  "Well if Dr. Einar Gip said I can't stop the Plavix or the Aspirin, I guess I'll have to hold off."  Okay, take care.

## 2016-01-02 DIAGNOSIS — E559 Vitamin D deficiency, unspecified: Secondary | ICD-10-CM | POA: Diagnosis not present

## 2016-01-02 DIAGNOSIS — I25119 Atherosclerotic heart disease of native coronary artery with unspecified angina pectoris: Secondary | ICD-10-CM | POA: Diagnosis not present

## 2016-01-02 DIAGNOSIS — E7800 Pure hypercholesterolemia, unspecified: Secondary | ICD-10-CM | POA: Diagnosis not present

## 2016-01-12 DIAGNOSIS — I25709 Atherosclerosis of coronary artery bypass graft(s), unspecified, with unspecified angina pectoris: Secondary | ICD-10-CM | POA: Diagnosis not present

## 2016-01-12 DIAGNOSIS — I10 Essential (primary) hypertension: Secondary | ICD-10-CM | POA: Diagnosis not present

## 2016-01-12 DIAGNOSIS — I209 Angina pectoris, unspecified: Secondary | ICD-10-CM | POA: Diagnosis not present

## 2016-01-12 DIAGNOSIS — I25119 Atherosclerotic heart disease of native coronary artery with unspecified angina pectoris: Secondary | ICD-10-CM | POA: Diagnosis not present

## 2016-01-24 DIAGNOSIS — I25709 Atherosclerosis of coronary artery bypass graft(s), unspecified, with unspecified angina pectoris: Secondary | ICD-10-CM | POA: Diagnosis not present

## 2016-01-24 DIAGNOSIS — R29898 Other symptoms and signs involving the musculoskeletal system: Secondary | ICD-10-CM | POA: Diagnosis not present

## 2016-02-06 ENCOUNTER — Ambulatory Visit (INDEPENDENT_AMBULATORY_CARE_PROVIDER_SITE_OTHER): Payer: Medicare HMO | Admitting: Neurology

## 2016-02-06 ENCOUNTER — Encounter: Payer: Self-pay | Admitting: Neurology

## 2016-02-06 VITALS — BP 139/87 | HR 60 | Ht 70.0 in | Wt 220.4 lb

## 2016-02-06 DIAGNOSIS — T466X5A Adverse effect of antihyperlipidemic and antiarteriosclerotic drugs, initial encounter: Principal | ICD-10-CM

## 2016-02-06 DIAGNOSIS — G720 Drug-induced myopathy: Secondary | ICD-10-CM

## 2016-02-06 NOTE — Progress Notes (Signed)
[id number]    Provider:  Dr Jaynee Eagles Referring Provider: Christain Sacramento, MD Primary Care Physician:  Woody Seller, MD  CC:  Leg pain  HPI:  Victor Mcclure is a 70 y.o. male here as a referral from Dr. Redmond Pulling for leg and calf pain. PMHx of CAD, obesity, CAD s/p CABG 03/2015, CRI, HTN, HLD, OSA on cpap, never smoker, occ. Alcohol use,  and unilateral kidney (donation to his son), discontinued statin due to severe leg pain to the point where he was bedridden. Leg pain has been ongoing for several years. Patient had open heart surgery last June, In September stated taking a statin and by February he was having pain in his knees that resembled his gout, he took out medicine and acupunture  That didn;t work. He stopped taking it in April/May and he has not taken it since. He had a lot of muscle pain in calf and thighs which has improved, he still has knee pain. The pain in the legs made it difficult to climb stairs, no falls, his left leg is still weak however, he still has difficulty climbing stairs. The pain would make it difficult to stand for long peroods of time, felt sore and weak. Now it is more knee pain. Possibly some muscle mass in his legs, unclear, no unprovoked weight loss (he has been trying), no visible muscle twitching, the symptoms started in the left and then was in both legs, no similar difficulty with the arms, he has not been exercising as much as he has in the past, no difficulty swallowing, no SOB or symptoms PBA. No other neurolgic No numbness or tingling or sensory changes.  No LBP. No FHx of neuromuscular disorder.  Reviewed notes, labs and imaging from outside physicians, which showed:  labwork 6/2017L Vit D 42, ldl 157, creatinine 1.21 otherwise CMP/CBC normal (per notes), hgbA1c 5.6,   EKG 03/2015 with borderline first degree AV block,non-specific ST segment depression, normal QT interval.   Review of Systems: Patient complains of symptoms per HPI as  well as the following symptoms: joint pain and arthritis, no current joint pain or SOB. Pertinent negatives per HPI. All others negative.   Social History   Social History  . Marital status: Widowed    Spouse name: N/A  . Number of children: 6  . Years of education: N/A   Occupational History  . Retired    Social History Main Topics  . Smoking status: Never Smoker  . Smokeless tobacco: Never Used  . Alcohol use 1.2 oz/week    2 Glasses of wine per week  . Drug use: No  . Sexual activity: Not Currently   Other Topics Concern  . Not on file   Social History Narrative   Lives alone   Caffeine use: daily use    Family History  Problem Relation Age of Onset  . Heart disease Mother   . Heart attack Father   . Diabetes Brother   . Stroke Neg Hx     Past Medical History:  Diagnosis Date  . Acute renal insufficiency    "I gave one kidney away"  . Basal cell carcinoma of left side of nose 2012  . Chest pressure   . Chronic tophaceous gout   . Complication of anesthesia    "kind of wild walking up; don't come out of it very well"   . Coronary artery disease   . Diaphragmatic hernia   . Donor of kidney for transplant 2006  .  Family history of adverse reaction to anesthesia    "son is kind of wild walking up; don't come out of it very well"  . Fatigue   . GERD (gastroesophageal reflux disease)   . History of hiatal hernia   . Hyperlipemia   . Hypertension   . Lower back pain   . Myalgia and myositis   . Obesity   . OSA on CPAP   . Pain in joint, pelvic region and thigh   . Shoulder pain   . Sinusitis   . Solitary kidney     Past Surgical History:  Procedure Laterality Date  . BASAL CELL CARCINOMA EXCISION Left 2012   "side of nose"  . CARDIAC CATHETERIZATION  10/2014  . CARDIAC CATHETERIZATION N/A 04/07/2015   Procedure: Left Heart Cath and Cors/Grafts Angiography;  Surgeon: Adrian Prows, MD;  Location: [place] [facility];  Service: Cardiovascular;  Laterality:  N/A;  . CORONARY ANGIOPLASTY    . CORONARY ARTERY BYPASS GRAFT N/A 12/10/2014   Procedure: CORONARY ARTERY BYPASS GRAFTING (CABG)x 5 -LIMA to LAD -SVG to DIAGONAL -SVG to OM -SEQUENTIAL SVG TO PDA, PLB;  Surgeon: Ivin Poot, MD;  Location: [place];  Service: Open Heart Surgery;  Laterality: N/A;  . KIDNEY DONATION  2006  . LEFT HEART CATHETERIZATION WITH CORONARY ANGIOGRAM N/A 11/04/2014   Procedure: LEFT HEART CATHETERIZATION WITH CORONARY ANGIOGRAM;  Surgeon: Adrian Prows, MD;  Location[facility] CATH LAB;  Service: Cardiovascular;  Laterality: N/A;  . REPAIR PERONEAL TENDONS ANKLE Right 2003?  . TEE WITHOUT CARDIOVERSION N/A 12/10/2014   Procedure: TRANSESOPHAGEAL ECHOCARDIOGRAM (TEE);  Surgeon: Ivin Poot, MD;  Location: [place];  Service: Open Heart Surgery;  Laterality: N/A;  . TONSILLECTOMY AND ADENOIDECTOMY      Current Outpatient Prescriptions  Medication Sig Dispense Refill  . Ascorbic Acid (VITAMIN C) 1000 MG tablet Take 1,000 mg by mouth daily.    Marland Kitchen aspirin EC 81 MG tablet Take 81 mg by mouth daily.    . clopidogrel (PLAVIX) 75 MG tablet Take 75 mg by mouth daily.    . Coenzyme Q10 (CO Q 10) 100 MG CAPS Take 200 mg by mouth daily.     . indomethacin (INDOCIN) 50 MG capsule Take 50 mg by mouth 3 (three) times daily as needed for mild pain or moderate pain.    Marland Kitchen losartan (COZAAR) 25 MG tablet Take 1 tablet (25 mg total) by mouth daily. 30 tablet 3  . Magnesium 500 MG CAPS Take 500 mg by mouth daily.    . metoprolol tartrate (LOPRESSOR) 25 MG tablet Take 1 tablet (25 mg total) by mouth 2 (two) times daily. 60 tablet 3  . Multiple Vitamin (MULTIVITAMIN) tablet Take 1 tablet by mouth daily.    . NON FORMULARY Take 1 oz by mouth 2 (two) times daily. Braggs whole Apple Cider Vinegar    . NON FORMULARY daily. CPAP    . Omega-3 Fatty Acids (FISH OIL PO) Take 1 tablet by mouth daily.    . TURMERIC PO Take 750 mg by mouth daily.     No current facility-administered medications for this visit.       Allergies as of 02/06/2016 - Review Complete 02/06/2016  Allergen Reaction Noted  . Statins  11/04/2014    Vitals: BP 139/87 (BP Location: Right Arm, Patient Position: Sitting, Cuff Size: Large)   Pulse 60   Ht 5\' 10"  (1.778 m)   Wt 220 lb 6.4 oz (100 kg)   BMI  31.62 kg/m  Last Weight:  Wt Readings from Last 1 Encounters:  02/06/16 220 lb 6.4 oz (100 kg)   Last Height:   Ht Readings from Last 1 Encounters:  02/06/16 5\' 10"  (1.778 m)    Physical exam: Exam: Gen: NAD, conversant, well nourised, obese, well groomed                     CV: RRR, no MRG. No Carotid Bruits. No peripheral edema, warm, nontender Eyes: Conjunctivae clear without exudates or hemorrhage  Neuro: Detailed Neurologic Exam  Speech:    Speech is normal; fluent and spontaneous with normal comprehension.  Cognition:    The patient is oriented to person, place, and time;     recent and remote memory intact;     language fluent;     normal attention, concentration,     fund of knowledge Cranial Nerves:    The pupils are equal, round, and reactive to light. The fundi are normal and spontaneous venous pulsations are present. Visual fields are full to finger confrontation. Extraocular movements are intact. Trigeminal sensation is intact and the muscles of mastication are normal. The face is symmetric. The palate elevates in the midline. Hearing intact. Voice is normal. Shoulder shrug is normal. The tongue has normal motion without fasciculations.   Coordination:    Normal finger to nose and heel to shin. Normal rapid alternating movements.   Gait:    Heel-toe and tandem gait are normal.   Motor Observation:    No asymmetry, no atrophy, and no involuntary movements noted. Tone:    Normal muscle tone.    Posture:    Posture is normal. normal erect    Strength:    Strength is V/V in the upper and lower limbs.      Sensation: intact to LT     Reflex Exam:  DTR's: Absent AJs otherwise deep  tendon reflexes in the upper and lower extremities are normal bilaterally.   Toes:    The toes are downgoing bilaterally.   Clonus:    Clonus is absent.      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 His muscle pain is improving. Discussed performing some more labwork and an emg/ncs but after discussion patient decided to hold off. Neurologic exam is normal, no fasciculations, no weakness. Will monitor, he is to follow up if needed. I don;t have the CK values from Dr. Einar Gip which were dran at last appointment, he also has a repeat scheduled in August will request those from Dr. Einar Gip.   Muscle pain: patient declines emg/ncs at this time. His strength is 5/5, will follow. Will request CK values from Dr. Einar Gip. I recommend discussing with Dr. Einar Gip the new PCSK9 inhibitor medications.  Knee pain: If continues, recommend knee xrays and workup for osteoarthritis.   Follow up as needed.   Sarina Ill, MD  [facility] [address] [place], [place] [address]  Phone [phone] Fax [phone]

## 2016-02-06 NOTE — Patient Instructions (Signed)
Overall you are doing great but I do want to suggest a few things today:   Remember to drink plenty of fluid, eat healthy meals and do not skip any meals. Try to eat protein with a every meal and eat a healthy snack such as fruit or nuts in between meals. Try to keep a regular sleep-wake schedule and try to exercise daily, particularly in the form of walking, 20-30 minutes a day, if you can.   I would like to see you back if needed, sooner if we need to. Please call us with any interim questions, concerns, problems, updates or refill requests.   Our phone number is [phone]. We also have an after hours call service for urgent matters and there is a physician on-call for urgent questions. For any emergencies you know to call 911 or go to the nearest emergency room

## 2016-02-28 DIAGNOSIS — I209 Angina pectoris, unspecified: Secondary | ICD-10-CM | POA: Diagnosis not present

## 2016-02-28 DIAGNOSIS — E7800 Pure hypercholesterolemia, unspecified: Secondary | ICD-10-CM | POA: Diagnosis not present

## 2016-03-06 DIAGNOSIS — I10 Essential (primary) hypertension: Secondary | ICD-10-CM | POA: Diagnosis not present

## 2016-03-06 DIAGNOSIS — I25709 Atherosclerosis of coronary artery bypass graft(s), unspecified, with unspecified angina pectoris: Secondary | ICD-10-CM | POA: Diagnosis not present

## 2016-03-06 DIAGNOSIS — I25119 Atherosclerotic heart disease of native coronary artery with unspecified angina pectoris: Secondary | ICD-10-CM | POA: Diagnosis not present

## 2016-03-06 DIAGNOSIS — E7800 Pure hypercholesterolemia, unspecified: Secondary | ICD-10-CM | POA: Diagnosis not present

## 2016-05-14 DIAGNOSIS — Z6830 Body mass index (BMI) 30.0-30.9, adult: Secondary | ICD-10-CM | POA: Diagnosis not present

## 2016-05-14 DIAGNOSIS — I10 Essential (primary) hypertension: Secondary | ICD-10-CM | POA: Diagnosis not present

## 2016-05-14 DIAGNOSIS — E7800 Pure hypercholesterolemia, unspecified: Secondary | ICD-10-CM | POA: Diagnosis not present

## 2016-05-14 DIAGNOSIS — I25119 Atherosclerotic heart disease of native coronary artery with unspecified angina pectoris: Secondary | ICD-10-CM | POA: Diagnosis not present

## 2016-05-14 DIAGNOSIS — Z0000 Encounter for general adult medical examination without abnormal findings: Secondary | ICD-10-CM | POA: Diagnosis not present

## 2016-05-18 DIAGNOSIS — E7800 Pure hypercholesterolemia, unspecified: Secondary | ICD-10-CM | POA: Diagnosis not present

## 2016-05-18 DIAGNOSIS — I25119 Atherosclerotic heart disease of native coronary artery with unspecified angina pectoris: Secondary | ICD-10-CM | POA: Diagnosis not present

## 2016-05-28 DIAGNOSIS — N529 Male erectile dysfunction, unspecified: Secondary | ICD-10-CM | POA: Diagnosis not present

## 2016-05-28 DIAGNOSIS — I10 Essential (primary) hypertension: Secondary | ICD-10-CM | POA: Diagnosis not present

## 2016-05-28 DIAGNOSIS — I25119 Atherosclerotic heart disease of native coronary artery with unspecified angina pectoris: Secondary | ICD-10-CM | POA: Diagnosis not present

## 2016-05-28 DIAGNOSIS — I25709 Atherosclerosis of coronary artery bypass graft(s), unspecified, with unspecified angina pectoris: Secondary | ICD-10-CM | POA: Diagnosis not present

## 2016-07-16 DIAGNOSIS — M1A9XX1 Chronic gout, unspecified, with tophus (tophi): Secondary | ICD-10-CM | POA: Diagnosis not present

## 2016-07-24 DIAGNOSIS — R2241 Localized swelling, mass and lump, right lower limb: Secondary | ICD-10-CM | POA: Diagnosis not present

## 2016-07-24 DIAGNOSIS — M799 Soft tissue disorder, unspecified: Secondary | ICD-10-CM | POA: Diagnosis not present

## 2016-07-24 DIAGNOSIS — B9789 Other viral agents as the cause of diseases classified elsewhere: Secondary | ICD-10-CM | POA: Diagnosis not present

## 2016-07-24 DIAGNOSIS — J069 Acute upper respiratory infection, unspecified: Secondary | ICD-10-CM | POA: Diagnosis not present

## 2016-07-27 DIAGNOSIS — R2241 Localized swelling, mass and lump, right lower limb: Secondary | ICD-10-CM | POA: Diagnosis not present

## 2016-07-30 DIAGNOSIS — Z0000 Encounter for general adult medical examination without abnormal findings: Secondary | ICD-10-CM | POA: Diagnosis not present

## 2016-08-07 DIAGNOSIS — R2241 Localized swelling, mass and lump, right lower limb: Secondary | ICD-10-CM | POA: Diagnosis not present

## 2016-08-28 DIAGNOSIS — R2241 Localized swelling, mass and lump, right lower limb: Secondary | ICD-10-CM | POA: Diagnosis not present

## 2016-08-31 DIAGNOSIS — Z7902 Long term (current) use of antithrombotics/antiplatelets: Secondary | ICD-10-CM | POA: Diagnosis not present

## 2016-08-31 DIAGNOSIS — Z7982 Long term (current) use of aspirin: Secondary | ICD-10-CM | POA: Diagnosis not present

## 2016-08-31 DIAGNOSIS — I10 Essential (primary) hypertension: Secondary | ICD-10-CM | POA: Diagnosis not present

## 2016-08-31 DIAGNOSIS — Z7951 Long term (current) use of inhaled steroids: Secondary | ICD-10-CM | POA: Diagnosis not present

## 2016-08-31 DIAGNOSIS — K219 Gastro-esophageal reflux disease without esophagitis: Secondary | ICD-10-CM | POA: Diagnosis not present

## 2016-08-31 DIAGNOSIS — Z951 Presence of aortocoronary bypass graft: Secondary | ICD-10-CM | POA: Diagnosis not present

## 2016-08-31 DIAGNOSIS — Z955 Presence of coronary angioplasty implant and graft: Secondary | ICD-10-CM | POA: Diagnosis not present

## 2016-08-31 DIAGNOSIS — M1A9XX1 Chronic gout, unspecified, with tophus (tophi): Secondary | ICD-10-CM | POA: Diagnosis not present

## 2016-08-31 DIAGNOSIS — R2241 Localized swelling, mass and lump, right lower limb: Secondary | ICD-10-CM | POA: Diagnosis not present

## 2016-08-31 DIAGNOSIS — M1A0711 Idiopathic chronic gout, right ankle and foot, with tophus (tophi): Secondary | ICD-10-CM | POA: Diagnosis not present

## 2016-08-31 DIAGNOSIS — I2510 Atherosclerotic heart disease of native coronary artery without angina pectoris: Secondary | ICD-10-CM | POA: Diagnosis not present

## 2016-08-31 DIAGNOSIS — Z79899 Other long term (current) drug therapy: Secondary | ICD-10-CM | POA: Diagnosis not present

## 2016-11-05 DIAGNOSIS — E7800 Pure hypercholesterolemia, unspecified: Secondary | ICD-10-CM | POA: Diagnosis not present

## 2016-11-26 DIAGNOSIS — M1A9XX1 Chronic gout, unspecified, with tophus (tophi): Secondary | ICD-10-CM | POA: Diagnosis not present

## 2016-11-26 DIAGNOSIS — Z79899 Other long term (current) drug therapy: Secondary | ICD-10-CM | POA: Diagnosis not present

## 2016-11-30 DIAGNOSIS — N529 Male erectile dysfunction, unspecified: Secondary | ICD-10-CM | POA: Diagnosis not present

## 2016-11-30 DIAGNOSIS — E7800 Pure hypercholesterolemia, unspecified: Secondary | ICD-10-CM | POA: Diagnosis not present

## 2016-11-30 DIAGNOSIS — I10 Essential (primary) hypertension: Secondary | ICD-10-CM | POA: Diagnosis not present

## 2016-11-30 DIAGNOSIS — I25709 Atherosclerosis of coronary artery bypass graft(s), unspecified, with unspecified angina pectoris: Secondary | ICD-10-CM | POA: Diagnosis not present

## 2016-12-24 IMAGING — CR DG CHEST 1V PORT
1 series · 1 of 1 positions shown · non-contrast
Comparison: None.

CLINICAL DATA: CABG.

EXAM:
PORTABLE CHEST - 1 VIEW

[AP]
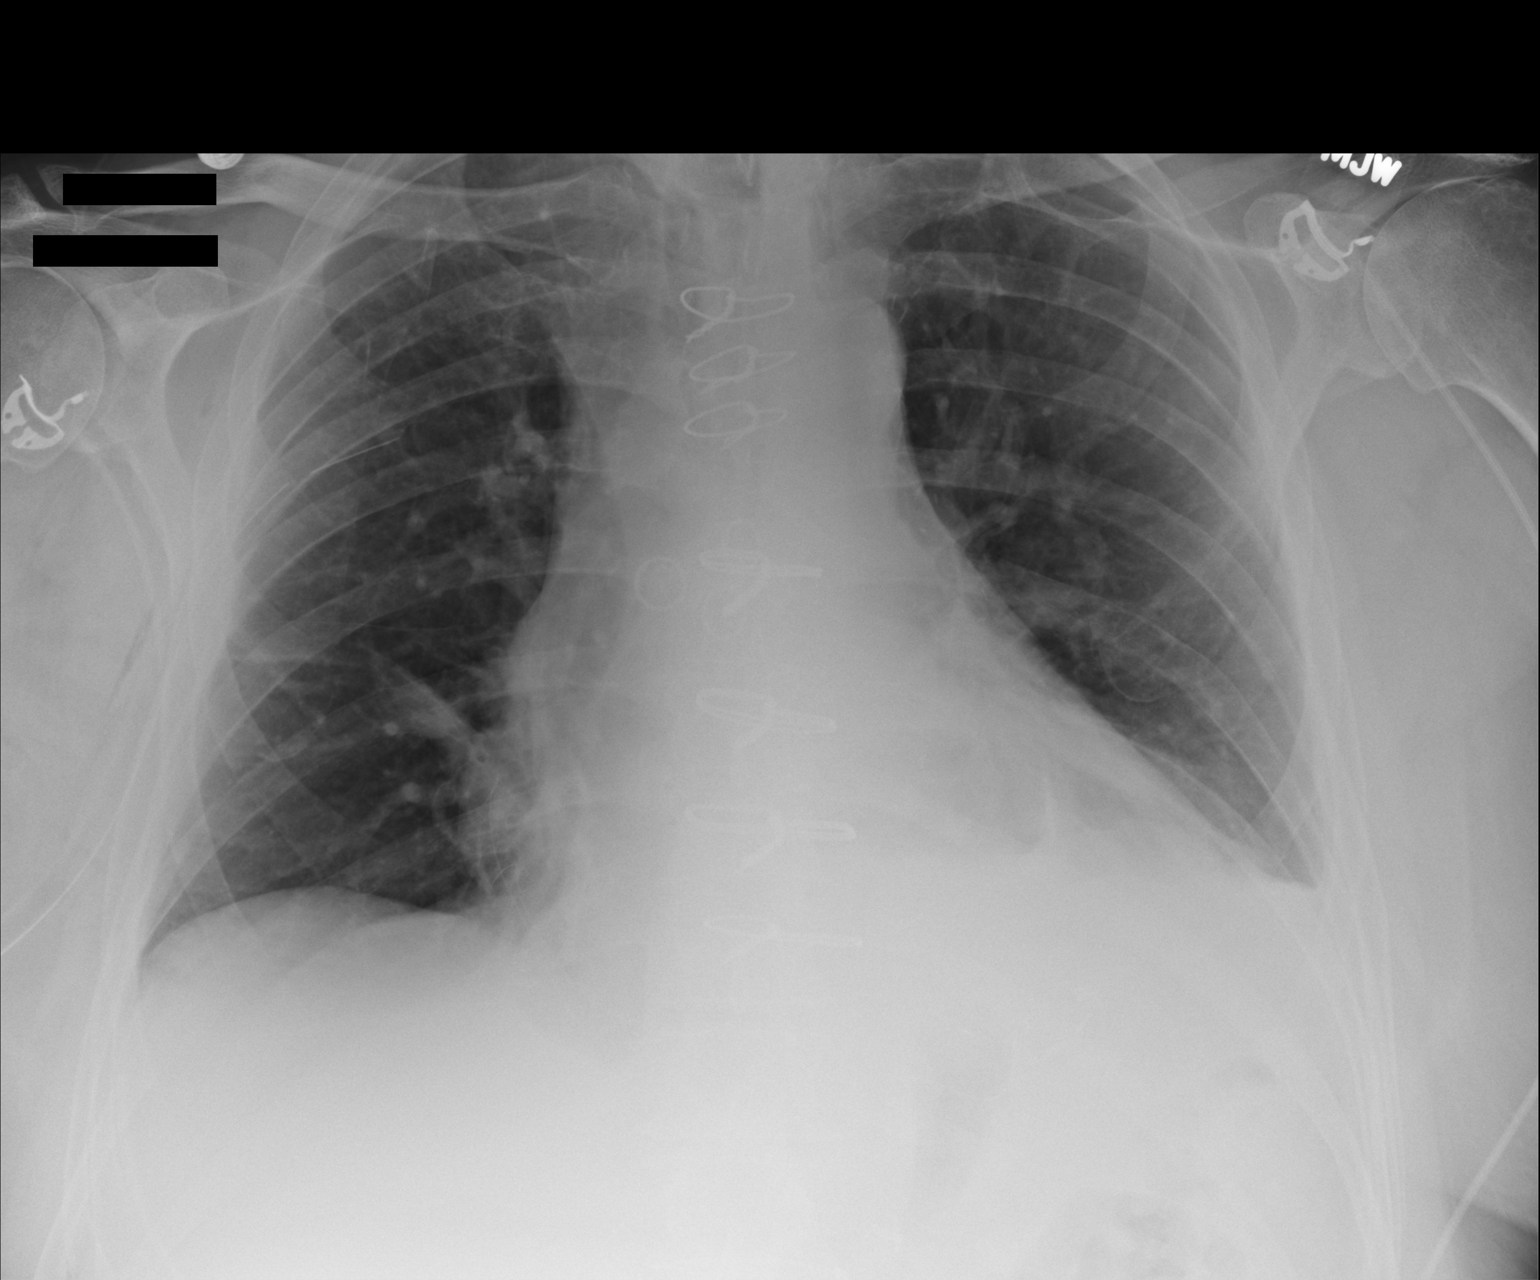

[1 of 1 positions shown; findings below may reference images not displayed]

FINDINGS: Interim removal of left chest tube. No pneumothorax. Mediastinum and
hilar structures are normal. Prior CABG. Stable cardiomegaly.
Pulmonary vascularity is normal. Bibasilar subsegmental atelectasis
with small left pleural effusion.
IMPRESSION: 1. Interim removal of left chest tube. No pneumothorax. Small left
pleural effusion.
2. Bibasilar subsegmental atelectasis.
3. Prior CABG.  Stable cardiomegaly.

## 2016-12-26 DIAGNOSIS — Z6827 Body mass index (BMI) 27.0-27.9, adult: Secondary | ICD-10-CM | POA: Diagnosis not present

## 2016-12-26 DIAGNOSIS — Z7982 Long term (current) use of aspirin: Secondary | ICD-10-CM | POA: Diagnosis not present

## 2016-12-26 DIAGNOSIS — R001 Bradycardia, unspecified: Secondary | ICD-10-CM | POA: Diagnosis not present

## 2016-12-26 DIAGNOSIS — Z0000 Encounter for general adult medical examination without abnormal findings: Secondary | ICD-10-CM | POA: Diagnosis not present

## 2016-12-26 DIAGNOSIS — I25709 Atherosclerosis of coronary artery bypass graft(s), unspecified, with unspecified angina pectoris: Secondary | ICD-10-CM | POA: Diagnosis not present

## 2016-12-26 DIAGNOSIS — I10 Essential (primary) hypertension: Secondary | ICD-10-CM | POA: Diagnosis not present

## 2016-12-26 DIAGNOSIS — E7800 Pure hypercholesterolemia, unspecified: Secondary | ICD-10-CM | POA: Diagnosis not present

## 2016-12-26 DIAGNOSIS — R58 Hemorrhage, not elsewhere classified: Secondary | ICD-10-CM | POA: Diagnosis not present

## 2016-12-26 IMAGING — DX DG CHEST 2V
2 series · 2 of 2 positions shown · non-contrast
Comparison: 12/13/2014

CLINICAL DATA: Status post chest tube removal

EXAM:
CHEST - 2 VIEW

[chest pa]
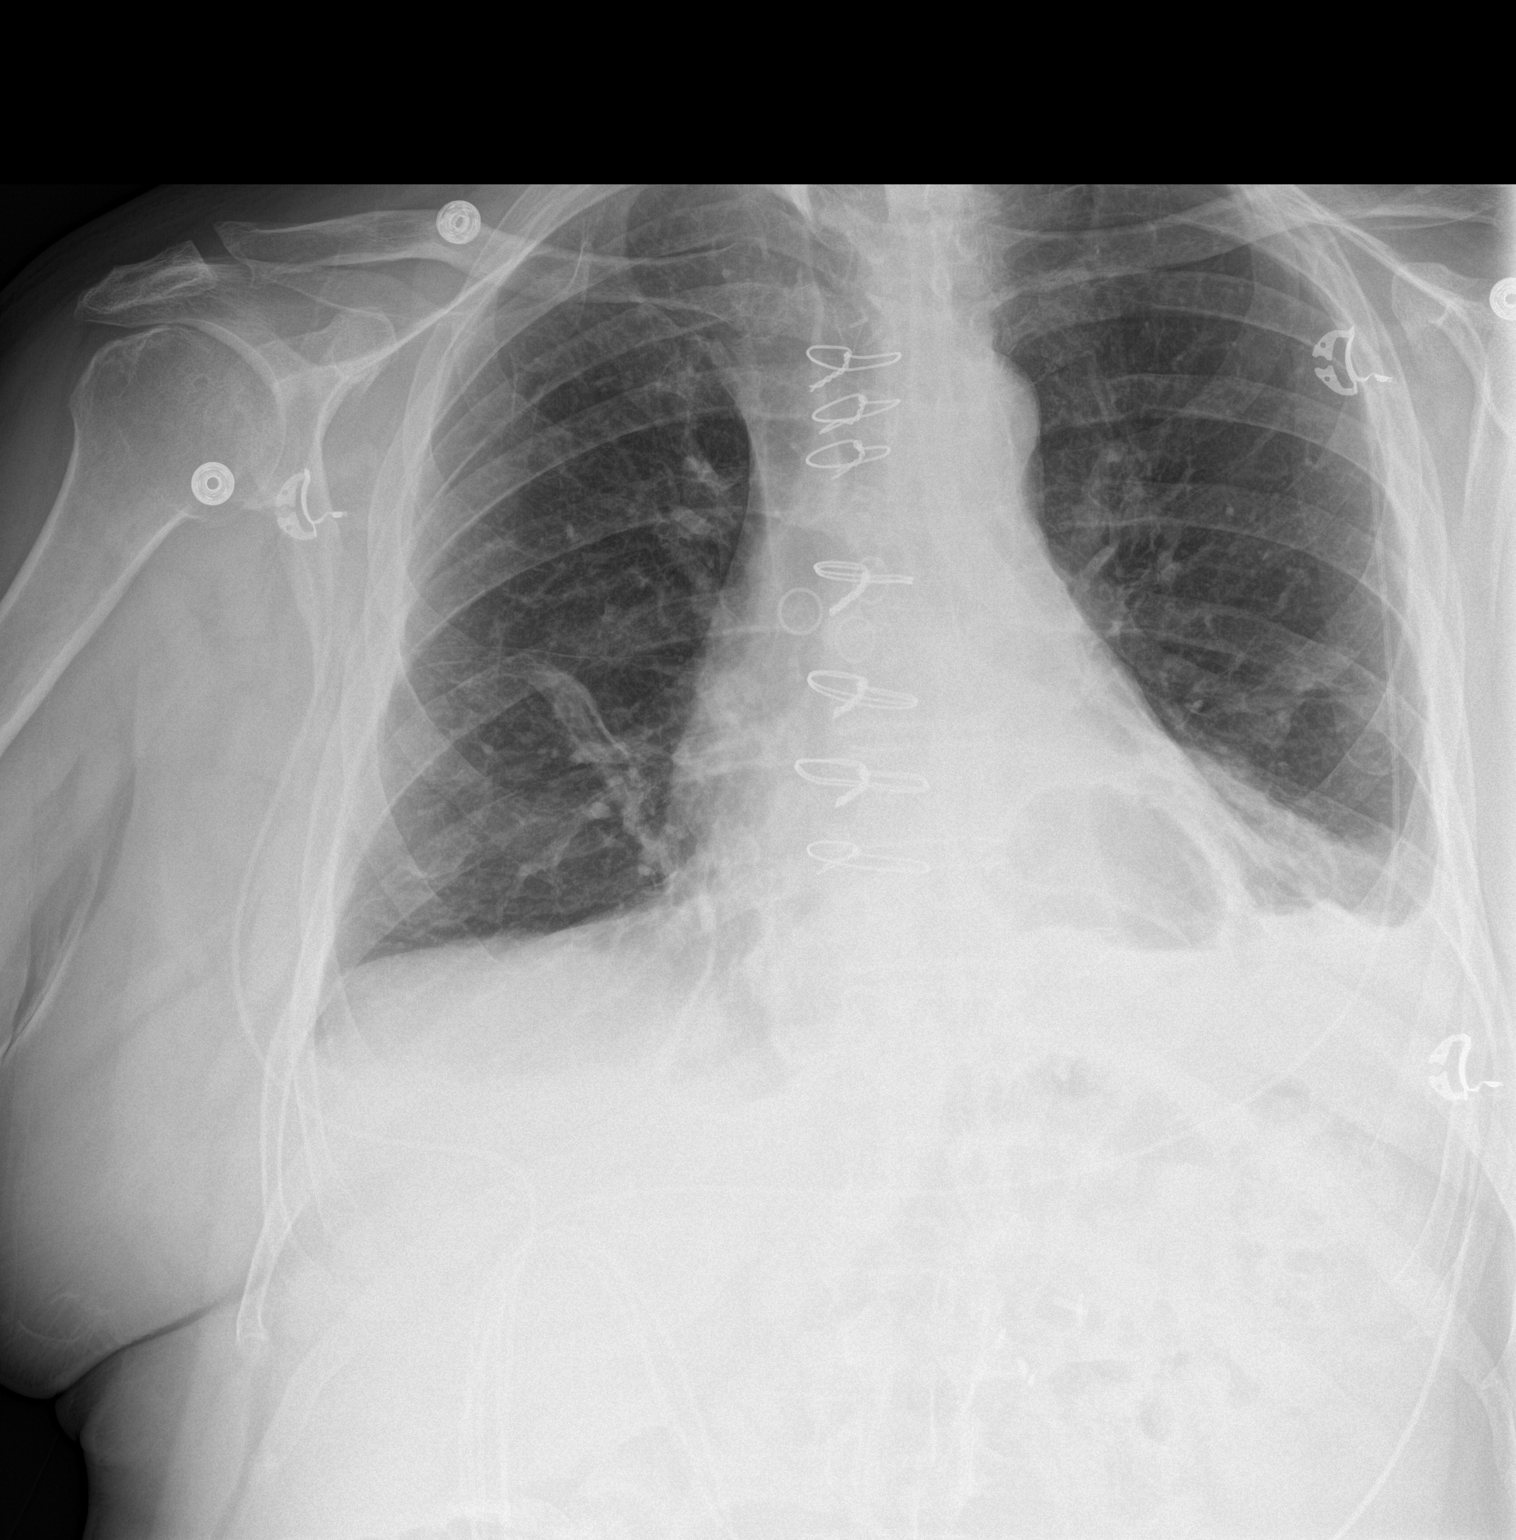

[chest lat]
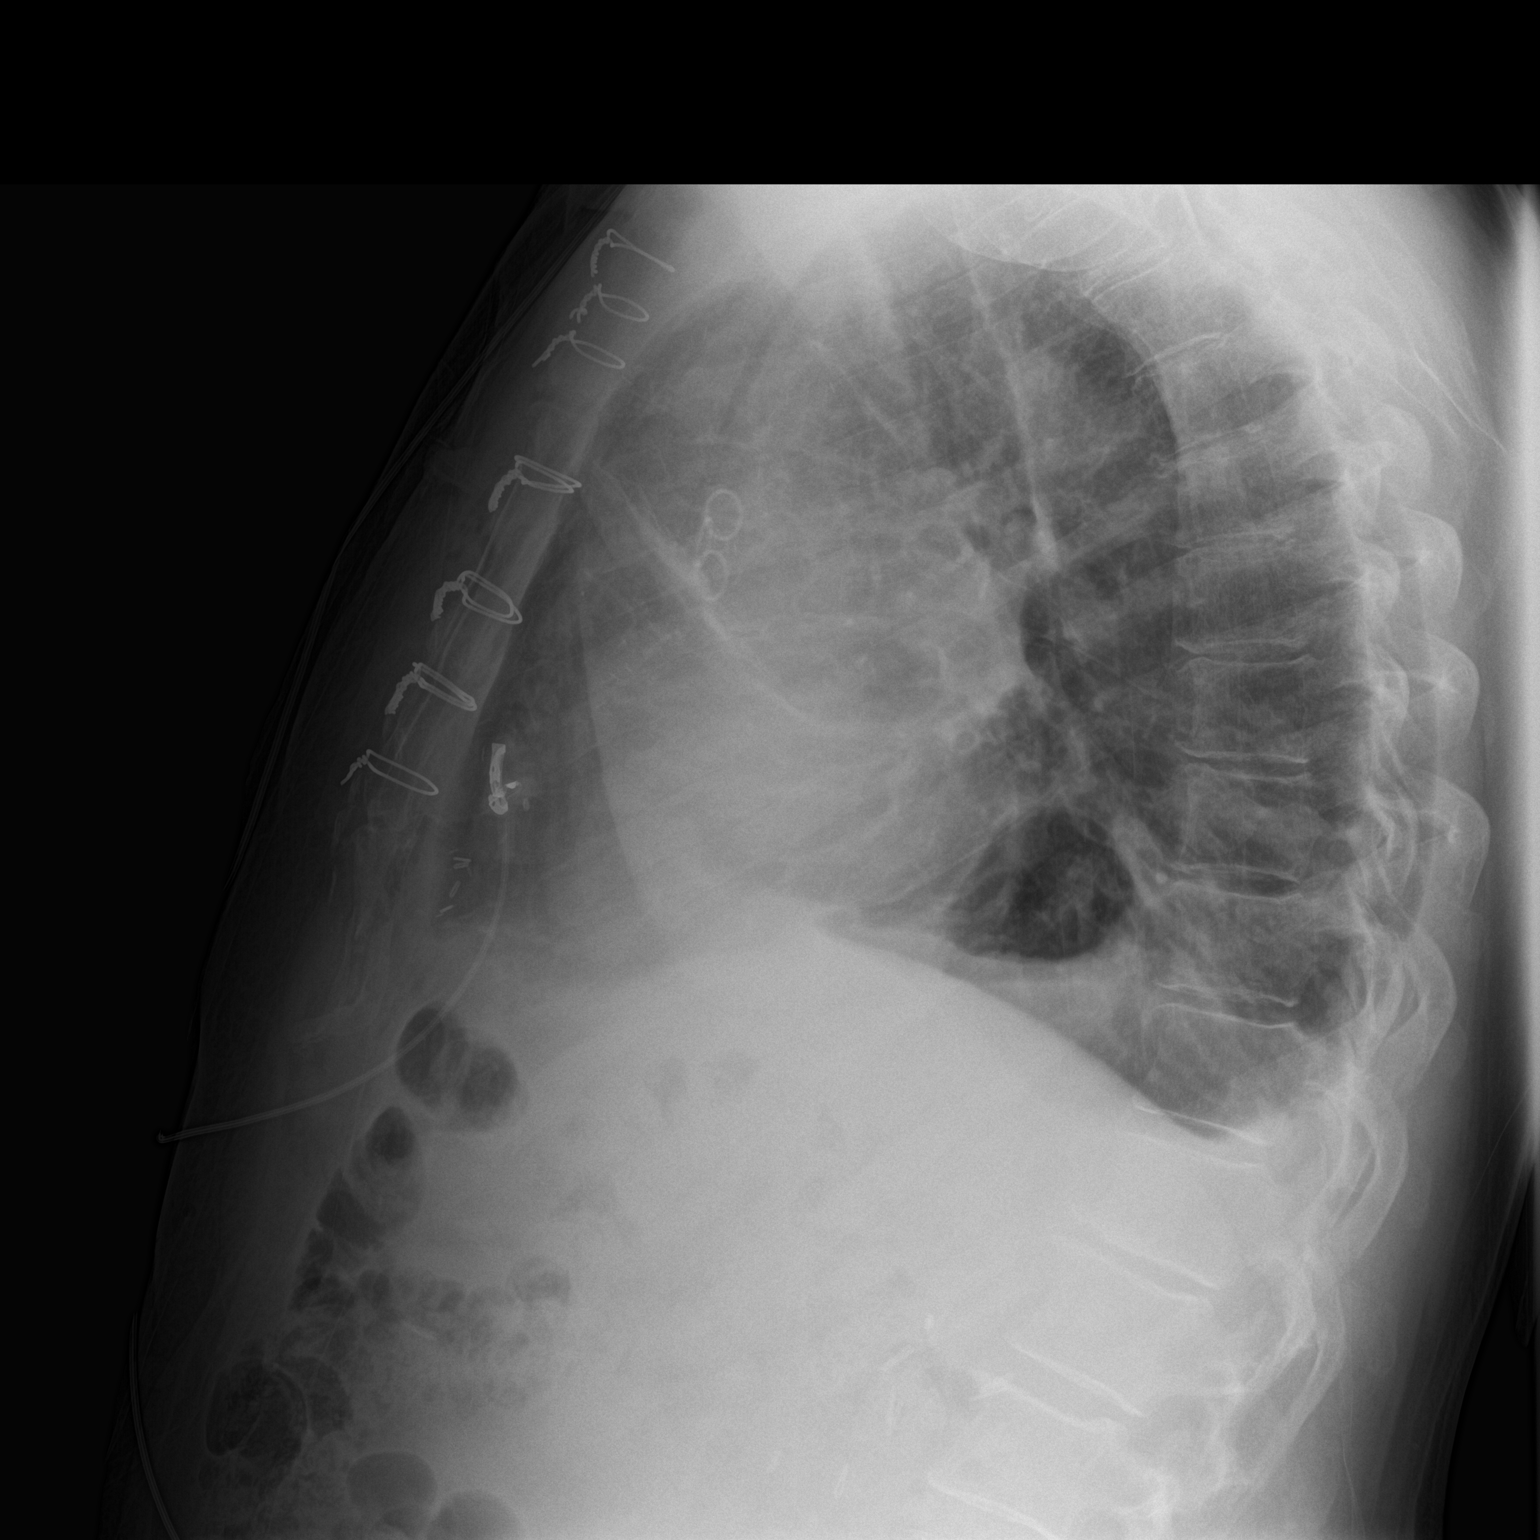

[2 of 2 positions shown; findings below may reference images not displayed]

FINDINGS: The right-sided chest tube has been removed in the interval. No
pneumothorax is noted. Mild right basilar scarring is again seen.
Small left pleural effusion and basilar atelectasis are noted. No
new focal infiltrate is seen.
IMPRESSION: No pneumothorax following right chest tube removal.

Stable left basilar changes.

## 2017-02-26 DIAGNOSIS — H5200 Hypermetropia, unspecified eye: Secondary | ICD-10-CM | POA: Diagnosis not present

## 2017-02-26 DIAGNOSIS — I10 Essential (primary) hypertension: Secondary | ICD-10-CM | POA: Diagnosis not present

## 2017-02-26 DIAGNOSIS — Z0100 Encounter for examination of eyes and vision without abnormal findings: Secondary | ICD-10-CM | POA: Diagnosis not present

## 2017-03-05 DIAGNOSIS — R2241 Localized swelling, mass and lump, right lower limb: Secondary | ICD-10-CM | POA: Diagnosis not present

## 2017-05-31 DIAGNOSIS — I25709 Atherosclerosis of coronary artery bypass graft(s), unspecified, with unspecified angina pectoris: Secondary | ICD-10-CM | POA: Diagnosis not present

## 2017-06-07 DIAGNOSIS — I25709 Atherosclerosis of coronary artery bypass graft(s), unspecified, with unspecified angina pectoris: Secondary | ICD-10-CM | POA: Diagnosis not present

## 2017-06-07 DIAGNOSIS — E7800 Pure hypercholesterolemia, unspecified: Secondary | ICD-10-CM | POA: Diagnosis not present

## 2017-06-07 DIAGNOSIS — N529 Male erectile dysfunction, unspecified: Secondary | ICD-10-CM | POA: Diagnosis not present

## 2017-06-07 DIAGNOSIS — I10 Essential (primary) hypertension: Secondary | ICD-10-CM | POA: Diagnosis not present

## 2018-09-14 ENCOUNTER — Encounter: Payer: Self-pay | Admitting: Cardiology

## 2018-09-14 DIAGNOSIS — I25810 Atherosclerosis of coronary artery bypass graft(s) without angina pectoris: Secondary | ICD-10-CM | POA: Insufficient documentation

## 2018-09-14 DIAGNOSIS — Z905 Acquired absence of kidney: Secondary | ICD-10-CM

## 2018-09-14 HISTORY — DX: Atherosclerosis of coronary artery bypass graft(s) without angina pectoris: I25.810

## 2018-09-14 HISTORY — DX: Acquired absence of kidney: Z90.5

## 2018-09-14 NOTE — Progress Notes (Signed)
Subjective:  Primary Physician:  Galvin Proffer, MD  Patient ID: Victor Mcclure, male    DOB: 1945/10/13, 73 y.o.   MRN: [id number]  Chief Complaint  Patient presents with  . Coronary Artery Disease    1 year f/u , labs   . Hypertension    HPI: DENSEL Mcclure  is a 73 y.o. 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  Patient underwent coronary artery bypass grafting due to multivessel disease on 12/10/2014. Coronary angiography on 04/07/2015, Unfortunately all his vein graft had closed. His LIMA to LAD was patent. He underwent angioplasty to the right coronary artery, has a 60-70% mid circumflex disease, being managed medically.  Patient has not been able to tolerate statins even at lowest dose due to severe myalgias and myositis and is now on Repatha and tolerating this well, however he discontinued this for the fear of side effects although he has not had any side effects.  Otherwise no other specific complaints today.   Past Medical History:  Diagnosis Date  . Acute renal insufficiency    "I gave one kidney away"  . Basal cell carcinoma of left side of nose 2012  . CAD (coronary artery disease) of bypass graft 09/14/2018  . Chest pressure   . Chronic tophaceous gout   . Complication of anesthesia    "kind of wild walking up; don't come out of it very well"   . Coronary artery disease   . Diaphragmatic hernia   . Donor of kidney for transplant 2006  . Family history of adverse reaction to anesthesia    "son is kind of wild walking up; don't come out of it very well"  . Fatigue   . GERD (gastroesophageal reflux disease)   . History of hiatal hernia   . Hyperlipemia   . Hypertension   . Lower back pain   . Myalgia and myositis   . Obesity   . OSA on CPAP   . Pain in joint, pelvic region and  thigh   . Shoulder pain   . Sinusitis   . Solitary kidney   . 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 BASAL CELL CARCINOMA EXCISION Left 2012   "side of nose"  . CARDIAC CATHETERIZATION  10/2014  . CARDIAC CATHETERIZATION N/A 04/07/2015   Procedure: Left Heart Cath and Cors/Grafts Angiography;  Surgeon: Adrian Prows, MD;  Location: [place] [facility];  Service: Cardiovascular;  Laterality: N/A;  . CORONARY ANGIOPLASTY    . CORONARY ARTERY BYPASS GRAFT N/A 12/10/2014   Procedure: CORONARY ARTERY BYPASS GRAFTING (CABG)x 5 -LIMA to LAD -SVG to DIAGONAL -SVG to OM -SEQUENTIAL SVG TO PDA, PLB;  Surgeon: Ivin Poot, MD;  Location: [place];  Service: Open Heart Surgery;  Laterality: N/A;  . KIDNEY DONATION  2006  . LEFT HEART CATHETERIZATION WITH CORONARY ANGIOGRAM N/A 11/04/2014   Procedure: LEFT HEART CATHETERIZATION WITH CORONARY ANGIOGRAM;  Surgeon: Adrian Prows, MD;  [facility];  Service: Cardiovascular;  Laterality: N/A;  . REPAIR PERONEAL TENDONS ANKLE Right 2003?  . TEE WITHOUT CARDIOVERSION N/A 12/10/2014   Procedure: TRANSESOPHAGEAL ECHOCARDIOGRAM (TEE);  Surgeon: Ivin Poot, MD;  Location: [place];  Service: Open Heart Surgery;  Laterality: N/A;  . TONSILLECTOMY  AND ADENOIDECTOMY      Social History   Socioeconomic History  . Marital status: Widowed    Spouse name: Not on file  . Number of children: 6  . Years of education: Not on file  . Highest education level: Not on file  Occupational History  . Occupation: Retired  Scientific laboratory technician  . Financial resource strain: Not on file  . Food insecurity:    Worry: Not on file    Inability: Not on file  . Transportation needs:    Medical: Not on file    Non-medical: Not on file  Tobacco Use  . Smoking status: Never Smoker  . Smokeless tobacco: Never Used  Substance and Sexual Activity  . Alcohol use: Yes    Alcohol/week: 2.0 standard drinks     Types: 2 Glasses of wine per week  . Drug use: No  . Sexual activity: Not Currently  Lifestyle  . Physical activity:    Days per week: Not on file    Minutes per session: Not on file  . Stress: Not on file  Relationships  . Social connections:    Talks on phone: Not on file    Gets together: Not on file    Attends religious service: Not on file    Active member of club or organization: Not on file    Attends meetings of clubs or organizations: Not on file    Relationship status: Not on file  . Intimate partner violence:    Fear of current or ex partner: Not on file    Emotionally abused: Not on file    Physically abused: Not on file    Forced sexual activity: Not on file  Other Topics Concern  . Not on file  Social History Narrative   Lives alone   Caffeine use: daily use    Current Outpatient Medications on File Prior to Visit  Medication Sig Dispense Refill  . Ascorbic Acid (VITAMIN C) 1000 MG tablet Take 1,000 mg by mouth daily.    Marland Kitchen aspirin EC 81 MG tablet Take 81 mg by mouth daily.    . indomethacin (INDOCIN) 50 MG capsule Take 50 mg by mouth 3 (three) times daily as needed for mild pain or moderate pain.    . Magnesium 500 MG CAPS Take 500 mg by mouth daily.    . Multiple Vitamin (MULTIVITAMIN) tablet Take 1 tablet by mouth daily.    . NON FORMULARY Take 1 oz by mouth 2 (two) times daily. Braggs whole Apple Cider Vinegar    . Omega-3 Fatty Acids (FISH OIL PO) Take 1 tablet by mouth daily.    . TURMERIC PO Take 750 mg by mouth daily.     No current facility-administered medications on file prior to visit.     Review of Systems  Constitutional: Negative for malaise/fatigue and weight loss.  Respiratory: Negative for cough, hemoptysis and shortness of breath.   Cardiovascular: Negative for chest pain, palpitations, claudication and leg swelling.  Gastrointestinal: Negative for abdominal pain, blood in stool, constipation, heartburn and vomiting.  Genitourinary:  Negative for dysuria.  Musculoskeletal: Negative for joint pain and myalgias.  Neurological: Negative for dizziness, focal weakness and headaches.  Endo/Heme/Allergies: Does not bruise/bleed easily.  Psychiatric/Behavioral: Negative for depression. The patient is not nervous/anxious.   All other systems reviewed and are negative.      Objective:  Blood pressure (!) 143/91, pulse 63, height '5\' 10"'  (1.778 m), weight 188 lb 9.6 oz (85.5 kg), SpO2  98 %. Body mass index is 27.06 kg/m.  Physical Exam  Constitutional: He appears well-developed and well-nourished. No distress.  HENT:  Head: Atraumatic.  Eyes: Conjunctivae are normal.  Neck: Neck supple. No JVD present. No thyromegaly present.  Cardiovascular: Normal rate, regular rhythm, normal heart sounds and intact distal pulses. Exam reveals no gallop.  No murmur heard. Pulmonary/Chest: Effort normal and breath sounds normal.  Abdominal: Soft. Bowel sounds are normal.  Musculoskeletal: Normal range of motion.        General: No edema.  Neurological: He is alert.  Skin: Skin is warm and dry.  Psychiatric: He has a normal mood and affect.    CARDIAC STUDIES:   Echo- 09/29/2014 1. Left ventricle cavity is normal in size. Mild to moderate concentric hypertrophy of the left ventricle. Normal global wall motion. Calculated EF 62%. 2. Trace mitral regurgitation. 3. Mild tricuspid regurgitation. No evidence of pulmonary Hypertension.  Exercise Treadmill Stress Test 07/15/2016:  Indication: Hypertension, prior CAD.  Exercise capacity was normal. HR Response to Exercise: Appropriate BP Response to Exercise: Elevated resting BP- appropriate response  The patient exercised on Bruce protocol for  9:16 min. Patient achieved  10.54 METS and reached HR  138 bpm, which is  91 % of maximum age-predicted HR.  Stress test terminated due to fatigue. Chest Pain: none. Arrhythmias: none ST Changes: With peak exerercise, there was 1 to 1.5  mm  horizontal ST depression s in lead V4-V6 which return back to baseline immediately into recovery.  Overall Impression: Equicoval stress test.  Continue primary/secondary prevention.  Lower extremity arterial duplex 01/24/2016: No hemodynamically significant stenoses are identified in the lower extremity arterial system. This exam reveals normal perfusion of the lower extremities with RABI 0.98 and LABI 1.00 with triphasic waveforms throughout.   Assessment & Recommendations:   1. Coronary artery disease of native artery of native heart with stable angina pectoris ([place]) Coronary angiogram 04/07/2015: SVG to RCA, SVG to D1, SVG to Cx occluded. Patent LIMA to LAD. S/P Prox and mid RCA 3.5x91m Resolute DES. Mid Cx 60-70%. LVEF 60% (CABG on 12/10/2014 Dr. PDahlia Byes) If chest pain, consider FFR guided PCI to Cx.  EKG 09/15/2018: Sinus rhythm with first-degree AV block at the rate of 60 bpm, normal axis.  No evidence of ischemia, otherwise normal EKG. EKG 01/12/2016: Normal sinus rhythm at rate of 60 2. Coronary artery disease of bypass graft of native heart with stable angina pectoris ([place]  3. Essential hypertension  4. Hypercholesteremia  5. Statin intolerance- Rhabdomyolysis  6.  Laboratory Exam Labs 09/03/2018: Total cholesterol 220, triglycerides 80, HDL 53, LDL 156.  Non-HDL cholesterol 167.  Potassium 4.4, BUN 19, creatinine 1.09, eGFR greater than 60 mL, CMP otherwise normal.  EGFR 67 mL.  Vitamin D 32.  CBC normal except for mild macrocytosis.  Minimal decrease in platelets at 150 9K.  HB 14.7.   Recommendation:  I have discussed with him the effects of hyperlipidemia and severe coronary artery disease and that he should be back on Repatha.  He is now willing for the same, I will administer a dose today, obtain lipid profile testing in 6 weeks.  I did review his labs from care everywhere.  He has not had any angina pectoris, EKG does reveal new first-degree AV block but  asymptomatic, blood pressure is well controlled, his creatinine has improved and stage III chronic kidney disease is not evident.  I'll see him back on an annual basis.  He has moved  to  [facility], advised him that he should establish with a cardiologist there.  He was previously on losartan 25 mg and metoprolol tartarate  12.5 mg daily, blood pressure is elevated, advised him to resume losartan 25 mg daily at night, she is reluctant to taking any medication as it causes him to have dizziness and fatigue.  He also states that he will make an appointment to see his PCP soon.  Adrian Prows, MD, [facility] 09/15/2018, 10:17 AM [facility] [place] Pager: [phone] Office: [phone] If no answer Cell [phone]

## 2018-09-15 ENCOUNTER — Ambulatory Visit: Payer: Medicare HMO | Admitting: Cardiology

## 2018-09-15 ENCOUNTER — Encounter: Payer: Self-pay | Admitting: Cardiology

## 2018-09-15 VITALS — BP 143/91 | HR 63 | Ht 70.0 in | Wt 188.6 lb

## 2018-09-15 DIAGNOSIS — I10 Essential (primary) hypertension: Secondary | ICD-10-CM | POA: Diagnosis not present

## 2018-09-15 DIAGNOSIS — E7800 Pure hypercholesterolemia, unspecified: Secondary | ICD-10-CM | POA: Diagnosis not present

## 2018-09-15 DIAGNOSIS — I25708 Atherosclerosis of coronary artery bypass graft(s), unspecified, with other forms of angina pectoris: Secondary | ICD-10-CM

## 2018-09-15 DIAGNOSIS — I25118 Atherosclerotic heart disease of native coronary artery with other forms of angina pectoris: Secondary | ICD-10-CM

## 2018-09-15 DIAGNOSIS — Z0189 Encounter for other specified special examinations: Secondary | ICD-10-CM

## 2018-09-15 DIAGNOSIS — Z789 Other specified health status: Secondary | ICD-10-CM | POA: Diagnosis not present

## 2018-09-15 MED ORDER — LOSARTAN POTASSIUM 25 MG PO TABS: 25.0000 mg | ORAL_TABLET | Freq: Every day | ORAL | 3 refills | Status: AC

## 2018-09-15 MED ORDER — EVOLOCUMAB WITH INFUSOR 420 MG/3.5ML ~~LOC~~ SOCT: 3.5000 mL | Freq: Once | SUBCUTANEOUS | 12 refills | Status: AC

## 2018-09-15 MED ORDER — EVOLOCUMAB WITH INFUSOR 420 MG/3.5ML ~~LOC~~ SOCT: 420.0000 mg | Freq: Once | SUBCUTANEOUS | Status: AC

## 2018-09-23 ENCOUNTER — Telehealth: Payer: Self-pay

## 2018-09-23 NOTE — Telephone Encounter (Signed)
Pt called stating that he has not started Repatha yet due to some concerns he had. He read the side effects and it mentioned influenza and diabetes mellitus and is concerned due to the coronavirus outbreak. Please advise.//ah

## 2018-09-24 NOTE — Telephone Encounter (Signed)
He can stop.  I have no concerns for either of the issues he mentions. I have not been informed of this

## 2018-09-25 NOTE — Telephone Encounter (Signed)
No alternatives for now. Bempidoic acid will be available soon  and not a statin, but will be brand name

## 2018-09-25 NOTE — Telephone Encounter (Signed)
He never started it. Any other alternatives or just hold off. He does not f/u until next year.//ah

## 2018-09-26 NOTE — Telephone Encounter (Signed)
Pt is aware and would like to be notified when Bempidoic acid becomes available since he does not follow up w/ you again until next year.//ah

## 2019-09-17 ENCOUNTER — Ambulatory Visit: Payer: Medicare HMO | Admitting: Cardiology
